# Patient Record
Sex: Male | Born: 1955 | Race: White | Hispanic: No | Marital: Married | Smoking: Former smoker
Health system: Southern US, Community
[De-identification: ages and names within clinical notes are randomized; demographics above are authoritative.]

## PROBLEM LIST (undated history)

## (undated) DIAGNOSIS — G4730 Sleep apnea, unspecified: Secondary | ICD-10-CM

## (undated) DIAGNOSIS — D649 Anemia, unspecified: Secondary | ICD-10-CM

## (undated) DIAGNOSIS — R519 Headache, unspecified: Secondary | ICD-10-CM

## (undated) DIAGNOSIS — M1990 Unspecified osteoarthritis, unspecified site: Secondary | ICD-10-CM

## (undated) DIAGNOSIS — C801 Malignant (primary) neoplasm, unspecified: Secondary | ICD-10-CM

## (undated) DIAGNOSIS — T8859XA Other complications of anesthesia, initial encounter: Secondary | ICD-10-CM

## (undated) DIAGNOSIS — T884XXA Failed or difficult intubation, initial encounter: Secondary | ICD-10-CM

## (undated) HISTORY — PX: BACK SURGERY: SHX140

## (undated) HISTORY — PX: SHOULDER ARTHROSCOPY: SHX128

## (undated) HISTORY — DX: Headache, unspecified: R51.9

## (undated) HISTORY — PX: MANDIBLE FRACTURE SURGERY: SHX706

## (undated) HISTORY — PX: CHOLECYSTECTOMY: SHX55

## (undated) HISTORY — DX: Malignant (primary) neoplasm, unspecified: C80.1

## (undated) HISTORY — DX: Unspecified osteoarthritis, unspecified site: M19.90

## (undated) HISTORY — PX: COLONOSCOPY: SHX174

## (undated) HISTORY — DX: Failed or difficult intubation, initial encounter: T88.4XXA

## (undated) HISTORY — PX: HERNIA REPAIR: SHX51

## (undated) HISTORY — DX: Anemia, unspecified: D64.9

## (undated) HISTORY — DX: Sleep apnea, unspecified: G47.30

## (undated) HISTORY — DX: Other complications of anesthesia, initial encounter: T88.59XA

---

## 2003-01-31 ENCOUNTER — Ambulatory Visit (HOSPITAL_BASED_OUTPATIENT_CLINIC_OR_DEPARTMENT_OTHER): Admission: RE | Admit: 2003-01-31 | Discharge: 2003-01-31 | Payer: Self-pay | Admitting: Orthopedic Surgery

## 2003-02-25 ENCOUNTER — Ambulatory Visit (HOSPITAL_BASED_OUTPATIENT_CLINIC_OR_DEPARTMENT_OTHER): Admission: RE | Admit: 2003-02-25 | Discharge: 2003-02-25 | Payer: Self-pay | Admitting: Otolaryngology

## 2008-10-03 ENCOUNTER — Encounter (INDEPENDENT_AMBULATORY_CARE_PROVIDER_SITE_OTHER): Payer: Self-pay | Admitting: Oncology

## 2008-10-03 ENCOUNTER — Other Ambulatory Visit: Admission: RE | Admit: 2008-10-03 | Discharge: 2008-10-03 | Payer: Self-pay | Admitting: Oncology

## 2011-05-17 NOTE — Op Note (Signed)
Kurt [name], [name] NO.:  [id number]   MEDICAL RECORD NO.:  [id number]                   PATIENT TYPE:  AMB   LOCATION:  DSC                                  FACILITY:  [facility]   PHYSICIAN:  Feliberto Gottron. Turner Daniels, M.D.                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   OPERATION PERFORMED:  Left shoulder arthroscopic anterior inferior  acromioplasty and distal clavicle spur excision.   SURGEON:  Feliberto Gottron. Turner Daniels, M.D.   ASSISTANTLaural Benes. Su Hilt, P.A.-C   ANESTHESIA:  Interscalene block plus general endotracheal.   ESTIMATED BLOOD LOSS:  Minimal.   FLUIDS REPLACED:  1L of crystalloid.   DRAINS:  None.   TOURNIQUET TIME:  None.   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  He has similar pain on the left  shoulder, plain x-ray showed a type 2 to type 3 subacromial and left  subclavicular spurring and he has failed conservative treatment with therapy  with anti-inflammatory medicine.  He has declined a cortisone injection and  desires elective left shoulder arthroscopic anterior, inferior acromioplasty  and distal clavicle spur excision.   DESCRIPTION OF PROCEDURE:  The patient was identified by arm band and taken  to the operating room at [facility] after the successful  induction of left upper extremity interscalene block anesthesia.  He  underwent the induction of general endotracheal anesthesia in the supine  position but did require use of a LMA with passage of the  endotracheal tube  through the LMA.  The patient was placed in the beach chair position.  The  left upper extremity was prepped and draped in the usual sterile fashion  from the wrist to the hemithorax.  The skin along the anterolateral and  posterior aspect of the acromion process was infiltrated with 2 to 3 cc of  0.5% Marcaine with epinephrine solution and then using a #11 blade, standard  portals were made 1.5 cm anterior to the [facility] joint, lateral to the junction  of the middle and posterior thirds of the acromion, posterior to the  posterolateral corner of the acromion process.  The inflow was placed  anteriorly, the arthroscope laterally and a 4.2 great white sucker shaver  posteriorly allowing subacromial bursectomy and outlining of the subacromial  spur which was then removed with a 4.5 hooded Vortex bur from the posterior  portal.  We did occasionally use the underwater electrocautery to take care  of any small bleeders.  We identified the subclavicular spur and likewise  removed this with a 4.5 hooded Vortex bur.  The shoulder was then taken  through a range of motion to assess the flexion abduction confirming an  intact rotator cuff externally.  The arthroscope was then introduced into  the glenohumeral joint itself using the posterior portal with the inflow  attached to the scope where we documented an intact labrum, biceps and  biceps anchor.  The articular cartilage of the glenoid and the humerus was  in excellent condition.  The subscapularis, supraspinatus and infraspinatus  tendon insertions were all intact.  At this point the shoulder was washed  out with normal saline solution.  The arthroscopic instruments were removed.  A dressing of Xeroform, 4 x 4 dressing sponges and paper tape applied.  Patient was then awakened and taken to the recovery room without difficulty.                                                  Feliberto Gottron. Turner Daniels, M.D.    Ovid Curd  D:  01/31/2003   T:  01/31/2003  Job:  [id number]

## 2017-07-25 DIAGNOSIS — R972 Elevated prostate specific antigen [PSA]: Secondary | ICD-10-CM | POA: Insufficient documentation

## 2017-07-25 DIAGNOSIS — N138 Other obstructive and reflux uropathy: Secondary | ICD-10-CM | POA: Insufficient documentation

## 2017-07-25 DIAGNOSIS — N401 Enlarged prostate with lower urinary tract symptoms: Secondary | ICD-10-CM | POA: Insufficient documentation

## 2020-01-26 ENCOUNTER — Other Ambulatory Visit: Payer: Self-pay | Admitting: Sports Medicine

## 2020-01-26 ENCOUNTER — Other Ambulatory Visit: Payer: Self-pay

## 2020-01-26 ENCOUNTER — Encounter: Payer: Self-pay | Admitting: Sports Medicine

## 2020-01-26 ENCOUNTER — Ambulatory Visit: Payer: BC Managed Care – PPO | Admitting: Sports Medicine

## 2020-01-26 DIAGNOSIS — M722 Plantar fascial fibromatosis: Secondary | ICD-10-CM | POA: Diagnosis not present

## 2020-01-26 DIAGNOSIS — M79672 Pain in left foot: Secondary | ICD-10-CM

## 2020-01-26 DIAGNOSIS — M79671 Pain in right foot: Secondary | ICD-10-CM | POA: Diagnosis not present

## 2020-01-26 NOTE — Patient Instructions (Signed)

## 2020-01-26 NOTE — Progress Notes (Signed)
Subjective: Kurt [name] is a 64 y.o. male patient presents to office with complaint of moderate heel pain on the left and right.  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  Denies any other pedal complaints.   Review of Systems  All other systems reviewed and are negative.   There are no problems to display for this patient.   No current outpatient medications on file prior to visit.   No current facility-administered medications on file prior to visit.    Not on File  Objective: Physical Exam General: The patient is alert and oriented x3 in no acute distress.  Dermatology: Skin is warm, dry and supple bilateral lower extremities. Nails 1-10 are normal. There is no erythema, edema, no eccymosis, no open lesions present. Integument is otherwise unremarkable.  Vascular: Dorsalis Pedis pulse and Posterior Tibial pulse are 2/4 bilateral. Capillary fill time is immediate to all digits.  Neurological: Grossly intact to light touch with an achilles reflex of +2/5 and a  negative Tinel's sign bilateral.  Musculoskeletal: Tenderness to palpation at the medial calcaneal tubercale and through the insertion of the plantar fascia on the left and right foot. No pain with compression of calcaneus bilateral. No pain with tuning fork to calcaneus bilateral. No pain with calf compression bilateral. There is decreased Ankle joint range of motion bilateral. All other joints range of motion within  normal limits bilateral. Strength 5/5 in all groups bilateral.    Assessment and Plan: Problem List Items Addressed This Visit    None    Visit Diagnoses    Plantar fasciitis, bilateral    -  Primary   Heel pain, bilateral          -Complete examination performed.  -Patient declined x-rays -Discussed with patient in detail the condition of plantar fasciitis, how this occurs and general treatment options. Explained both conservative and surgical treatments.  -Patient declined steroid injection or oral medications at this time -Recommended good supportive shoes and advised use of OTC insert until he can be seen for custom molded orthoses since these have worked well in the past for him before when he had orthotics from the good feet store. -Explained and dispensed to patient daily stretching exercises. -Recommend patient to ice affected area 1-2x daily. -Patient to return to office for orthotics or sooner if problems or questions arise.  Landis Martins, DPM

## 2020-01-27 ENCOUNTER — Other Ambulatory Visit: Payer: Self-pay

## 2020-01-27 ENCOUNTER — Ambulatory Visit: Payer: BC Managed Care – PPO | Admitting: Orthotics

## 2020-01-27 DIAGNOSIS — M722 Plantar fascial fibromatosis: Secondary | ICD-10-CM

## 2020-02-08 NOTE — Progress Notes (Signed)
Patient cast today for CMFO to address plantar fas; patient previous had OTS ([employer] feet) and they didn't seem to help.   Plan on Richy to fab

## 2020-08-11 ENCOUNTER — Other Ambulatory Visit: Payer: Self-pay

## 2020-08-11 ENCOUNTER — Ambulatory Visit: Payer: BC Managed Care – PPO | Admitting: Sports Medicine

## 2020-08-11 ENCOUNTER — Encounter: Payer: Self-pay | Admitting: Sports Medicine

## 2020-08-11 ENCOUNTER — Other Ambulatory Visit: Payer: Self-pay | Admitting: Sports Medicine

## 2020-08-11 DIAGNOSIS — M25571 Pain in right ankle and joints of right foot: Secondary | ICD-10-CM | POA: Diagnosis not present

## 2020-08-11 DIAGNOSIS — S93401A Sprain of unspecified ligament of right ankle, initial encounter: Secondary | ICD-10-CM

## 2020-08-11 NOTE — Progress Notes (Signed)
Subjective: Kurt [name] is a 64 y.o. male patient who presents to office for evaluation of right ankle pain. Patient complains of continued pain in the ankle after spraining it on July fork went to [facility] allergic care and was told that he did not have a break after he had an x-ray reports that he was icing and taping and does not recall why he stopped.  Reports pain 3 out of 10 swelling to right ankle. Patient denies any other pedal complaints.  There are no problems to display for this patient.   No current outpatient medications on file prior to visit.   No current facility-administered medications on file prior to visit.    Not on File  Objective:  General: Alert and oriented x3 in no acute distress  Dermatology: No open lesions bilateral lower extremities, no webspace macerations, no ecchymosis bilateral, all nails x 10 are well manicured.  Vascular: Dorsalis Pedis and Posterior Tibial pedal pulses palpable, Capillary Fill Time 3 seconds,(+) pedal hair growth bilateral, no edema bilateral lower extremities, Temperature gradient within normal limits.  Neurology: Kurt [name] sensation intact via light touch bilateral.  Musculoskeletal: Mild tenderness with palpation at lateral ankle ligaments especially over the anterior talofibular ligament on the right, negative tib-fib stress, history of instability. No pain with calf compression bilateral. Range of motion within normal limits with minimal guarding on right ankle. Strength within normal limits in all groups bilateral.   Assessment and Plan: Problem List Items Addressed This Visit    None    Visit Diagnoses    Moderate ankle sprain, right, initial encounter    -  Primary   Acute right ankle pain           -Complete examination performed -Patient had x-rays performed at [facility] and will get records to review however at this time we will proceed with symptomatically treating patient for ankle sprain since verbally he told me that  there was no break or fracture on previous x-rays -Discussed treatement options for sprain injury and advised patient that it can take several months to get better -Rx Tri-Lock ankle stabilizer to use as instructed -Recommend rest ice elevation daily until symptoms improve -Recommend good supportive shoes daily for foot type -Recommend to avoid activities that may be too strenuous that can cause symptoms to worsen -Patient to return to office in 1 month or sooner if condition worsens.  Kurt [name], DPM

## 2020-08-14 ENCOUNTER — Telehealth: Payer: Self-pay

## 2020-08-14 NOTE — Telephone Encounter (Signed)
Contacted Pt and LVM giving him recommendations from DR. Stover to keep brace, icng, elevations until symptoms improve. And Dr. Cannon Kettle has review his records from [facility] and there is no Fx

## 2020-08-14 NOTE — Telephone Encounter (Signed)
-----   Message from Landis Martins, [place] sent at 08/14/2020  9:00 AM EDT ----- Regarding: Records Let patient know that I reviewed his records from [facility]. Good news there is no fracture. His symptoms are consistent with a sprain injury. He should keep with brace, rest, ice, elevation until symptoms improve Thanks Dr. Chauncey Cruel

## 2020-09-12 ENCOUNTER — Ambulatory Visit: Payer: BC Managed Care – PPO | Admitting: Sports Medicine
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
## 2020-11-20 ENCOUNTER — Encounter: Payer: Self-pay | Admitting: Family Medicine

## 2020-11-20 ENCOUNTER — Other Ambulatory Visit: Payer: Self-pay

## 2020-11-20 ENCOUNTER — Ambulatory Visit (INDEPENDENT_AMBULATORY_CARE_PROVIDER_SITE_OTHER): Payer: BC Managed Care – PPO | Admitting: Family Medicine

## 2020-11-20 VITALS — BP 138/88 | HR 76 | Ht 74.5 in | Wt 252.0 lb

## 2020-11-20 DIAGNOSIS — M436 Torticollis: Secondary | ICD-10-CM

## 2020-11-20 DIAGNOSIS — M542 Cervicalgia: Secondary | ICD-10-CM

## 2020-11-20 NOTE — Patient Instructions (Addendum)
Thank you for coming in today.  I've referred you to Physical Therapy.  Let us know if you don't hear from them in one week.  You should hear from MRI scheduling within 1 week. If you do not hear please let me know.   Recheck after MRI.   View at [email or web address] using code: [email or web address]

## 2020-12-05 ENCOUNTER — Telehealth: Payer: Self-pay | Admitting: Family Medicine

## 2020-12-05 MED ORDER — LORAZEPAM 0.5 MG PO TABS: ORAL_TABLET | ORAL | 0 refills | Status: DC

## 2020-12-05 NOTE — Telephone Encounter (Signed)
Received request for anxiety medication from MRI technician for patient prior to MRI. Ativan prescribed for use prior to MRI.  Patient will need a driver.

## 2020-12-06 NOTE — Telephone Encounter (Signed)
Called and left detailed message regarding Ativan medication prescription to use prior to MRI on 12/09/20.

## 2020-12-09 ENCOUNTER — Other Ambulatory Visit: Payer: Self-pay

## 2020-12-09 ENCOUNTER — Ambulatory Visit (INDEPENDENT_AMBULATORY_CARE_PROVIDER_SITE_OTHER): Payer: BC Managed Care – PPO

## 2020-12-09 DIAGNOSIS — M47812 Spondylosis without myelopathy or radiculopathy, cervical region: Secondary | ICD-10-CM

## 2020-12-09 DIAGNOSIS — M4802 Spinal stenosis, cervical region: Secondary | ICD-10-CM

## 2020-12-09 DIAGNOSIS — M542 Cervicalgia: Secondary | ICD-10-CM

## 2020-12-09 IMAGING — MR MR CERVICAL SPINE W/O CM
5 series · 40 of 48 positions shown · non-contrast
Comparison: None.

CLINICAL DATA: Neck pain, chronic, degenerative changes on xray

EXAM:
MRI CERVICAL SPINE WITHOUT CONTRAST
TECHNIQUE: Multiplanar, multisequence MR imaging of the cervical spine was
performed. No intravenous contrast was administered.

[Series 4: T2 · sagittal · 3.0mm · 0.69mm/px · 6 of 13 slices shown (1 of 2)]
[im 1/13]
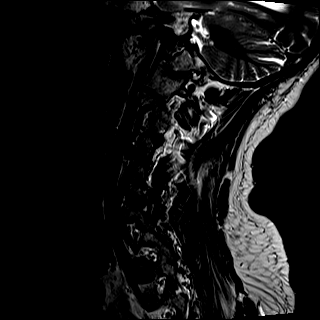
[im 3/13]
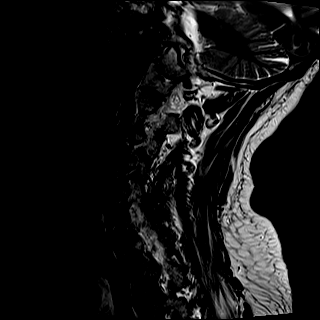
[im 5/13]
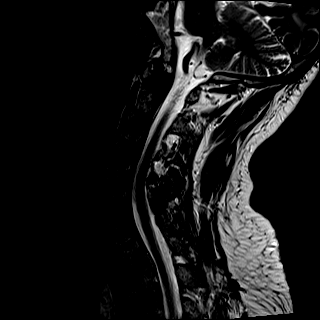
[im 8/13]
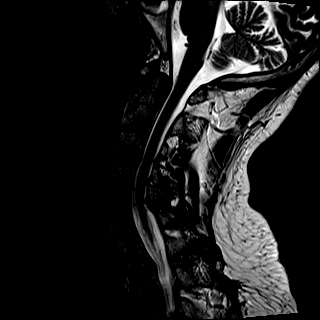
[im 10/13]
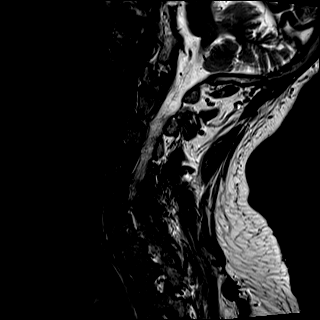
[im 13/13]
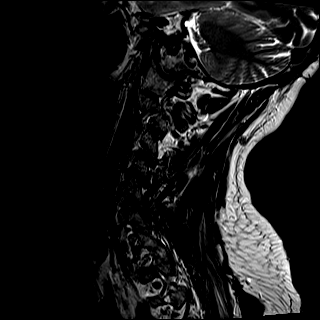

[Series 5: T1 · sagittal · 3.0mm · 0.86mm/px · 5 of 13 slices shown]
[im 1/13]
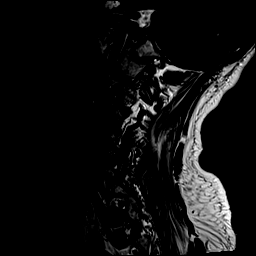
[im 4/13]
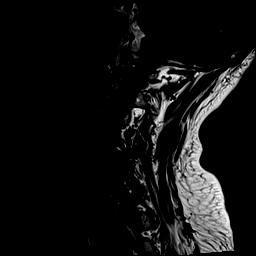
[im 7/13]
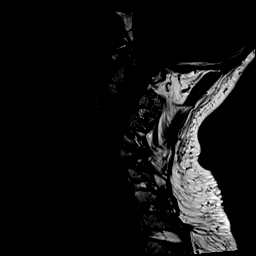
[im 10/13]
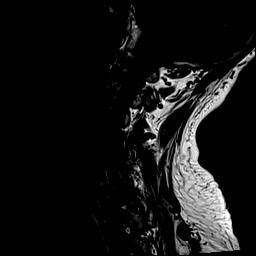
[im 13/13]
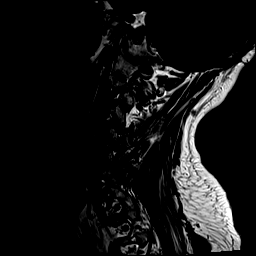

[Series 6: STIR · sagittal · 3.0mm · 0.69mm/px · 5 of 13 slices shown]
[im 1/13]
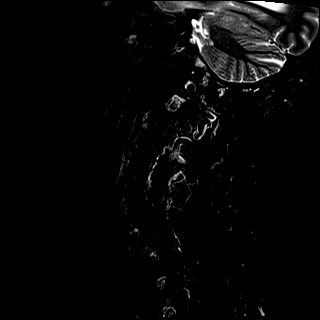
[im 4/13]
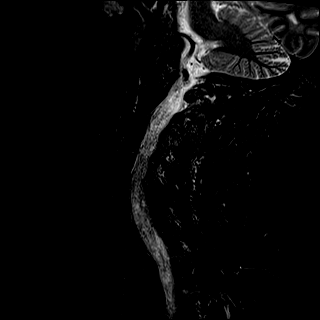
[im 7/13]
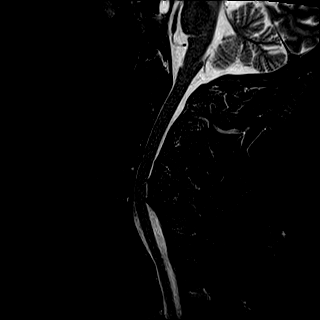
[im 10/13]
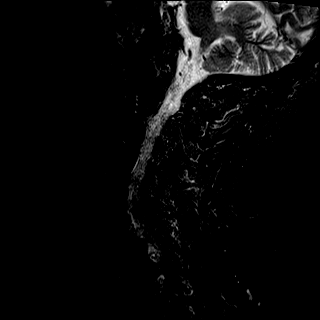
[im 13/13]
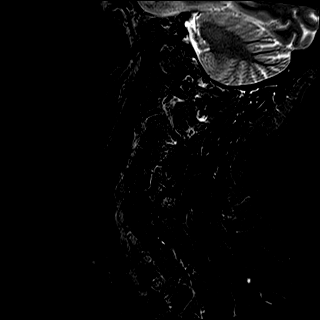

[Series 7: T2 · axial · 3.0mm · 0.62mm/px · z∈[-144,-8]mm · 16 of 38 slices shown (2 of 2)]
[im 1/38]
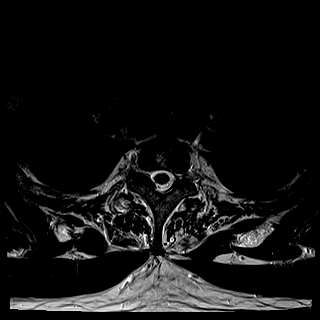
[im 3/38]
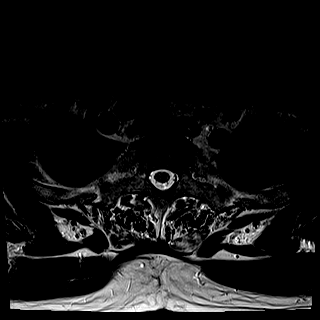
[im 5/38]
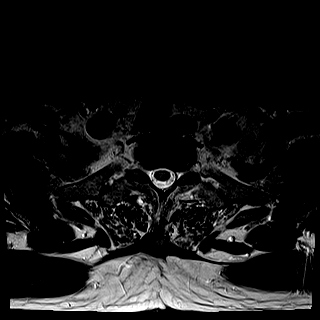
[im 8/38]
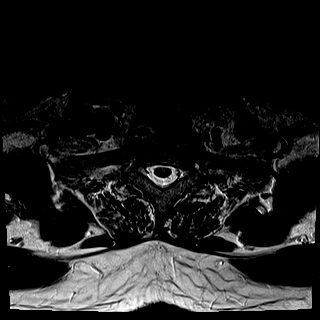
[im 10/38]
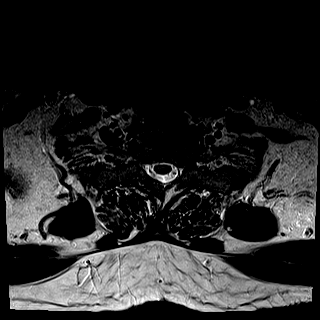
[im 13/38]
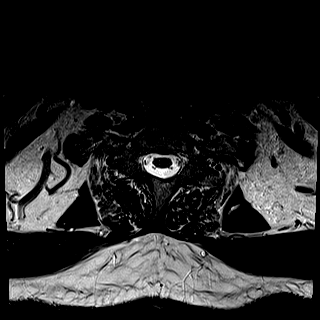
[im 15/38]
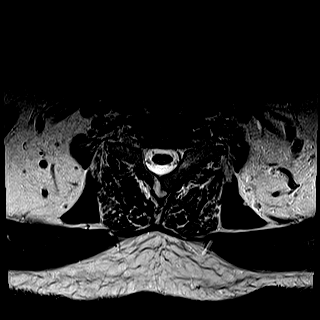
[im 18/38]
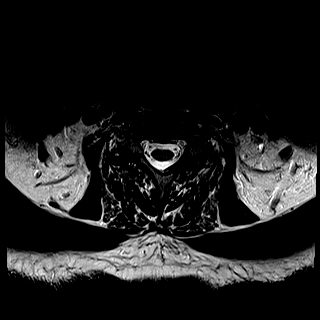
[im 20/38]
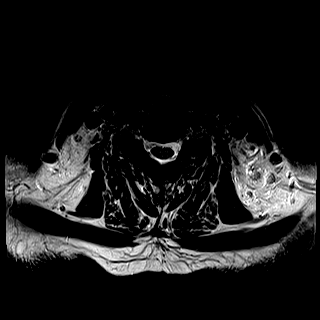
[im 23/38]
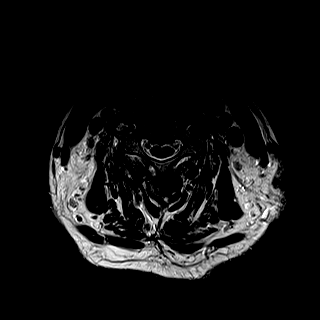
[im 25/38]
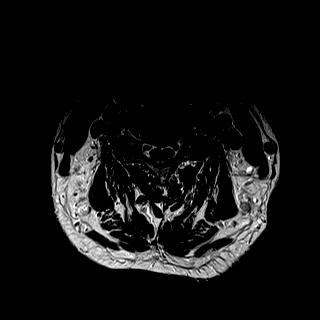
[im 28/38]
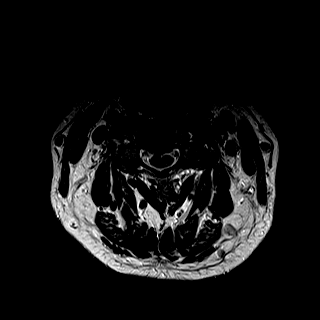
[im 30/38]
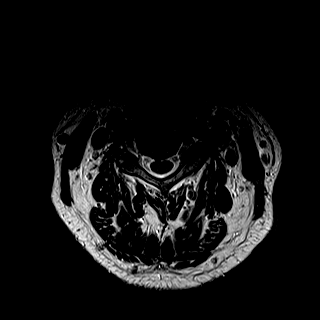
[im 33/38]
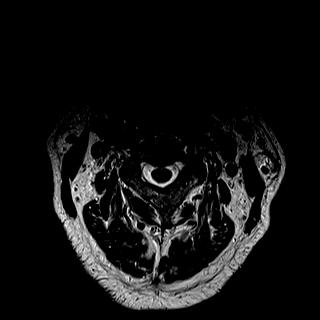
[im 35/38]
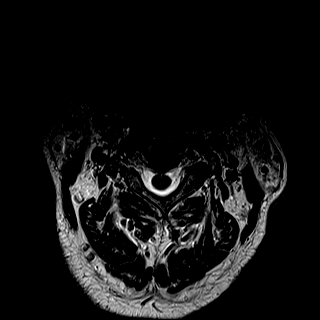
[im 38/38]
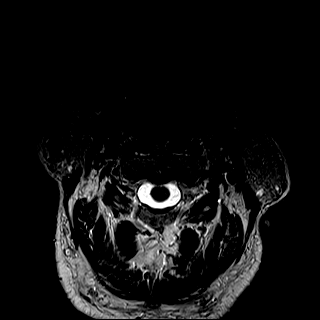

[Series 8: mpgr ax · axial · 3.0mm · 0.35mm/px · z∈[-126,+3]mm · 8 of 38 slices shown]
[im 3/38]
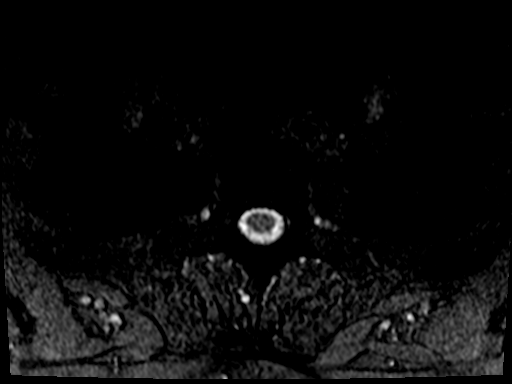
[im 8/38]
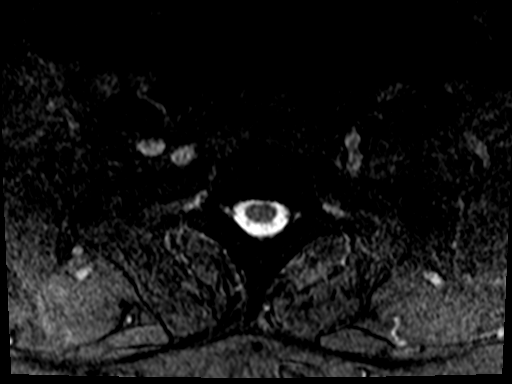
[im 13/38]
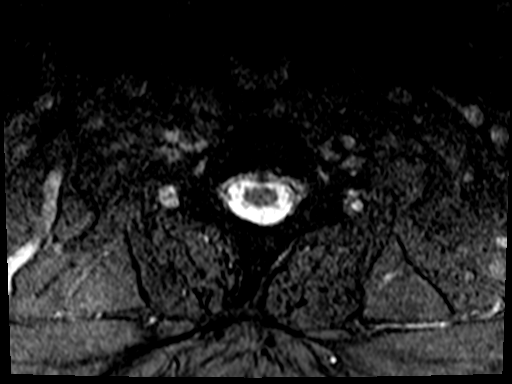
[im 18/38]
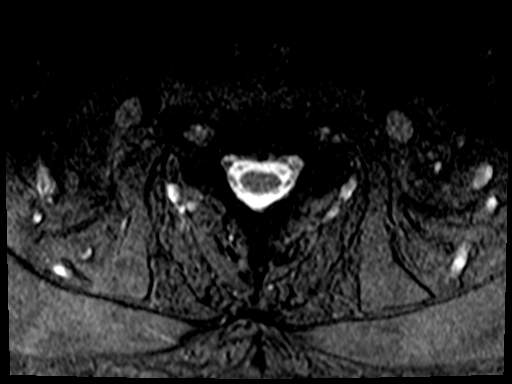
[im 23/38]
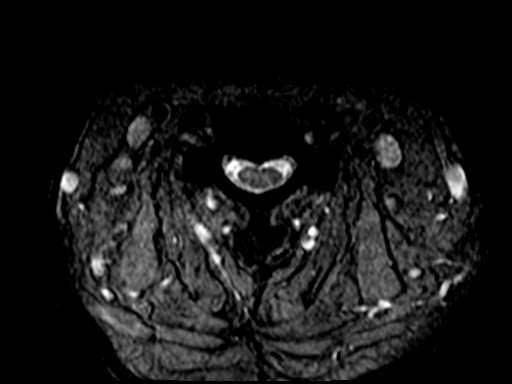
[im 28/38]
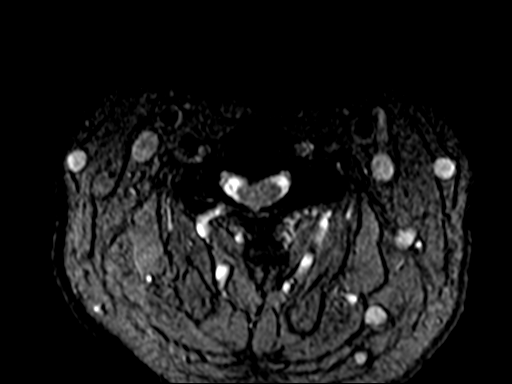
[im 33/38]
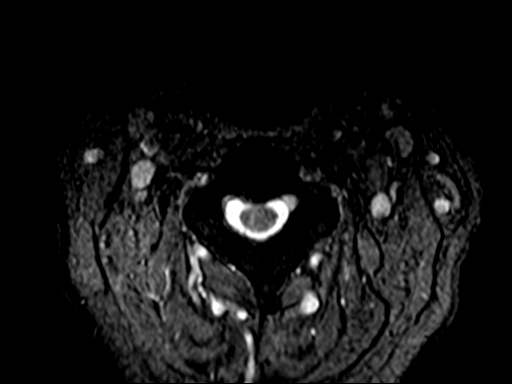
[im 38/38]
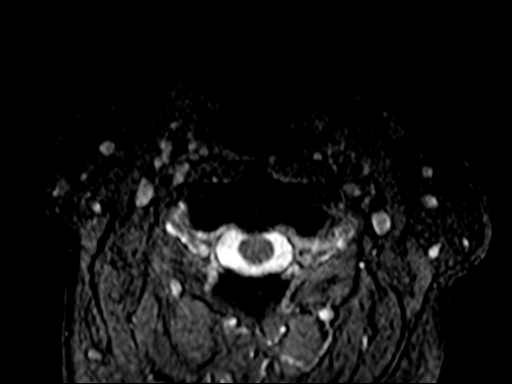

[40 of 48 positions shown; findings below may reference images not displayed]

FINDINGS: Alignment: Straightening of lordosis. Sequela of fusion at the C6-T1
levels.

Vertebrae: Normal bone marrow signal intensity. No focal osseous
lesion.

Cord: Normal signal and morphology.

Posterior Fossa, vertebral arteries: Negative.

Disc levels: Multilevel desiccation.

C2-3: No significant disc bulge. Patent spinal canal and neural
foramen. Minimal facet degenerative spurring.

C3-4: Small disc osteophyte complex with central protrusion abutting
the ventral cord. Uncovertebral and facet hypertrophy. Mild spinal
canal and bilateral neural foraminal narrowing.

C4-5: Small disc osteophyte complex with central protrusion abutting
the ventral cord. Uncovertebral and facet degenerative spurring.
Prominent ligamentum flavum. Mild spinal canal and bilateral neural
foraminal narrowing.

C5-6: Disc osteophyte complex with shallow central protrusion.
Uncovertebral and facet degenerative spurring. Patent spinal canal.
Mild bilateral neural foraminal narrowing.

C6-7: Minimal uncovertebral and facet degenerative spurring. Patent
spinal canal and right neural foramen. Mild left neural foraminal
narrowing.

C7-T1: No significant disc bulge. Patent spinal canal and neural
foramen.

Paraspinal tissues: Negative.
IMPRESSION: Multilevel spondylosis.  Sequela of fusion at the C6-T1 levels.

Mild C3-5 spinal canal and bilateral neural foraminal narrowing.

Mild bilateral C5-6 and left C6-7 neural foraminal narrowing.

## 2020-12-13 ENCOUNTER — Encounter: Payer: Self-pay | Admitting: Urology

## 2020-12-13 ENCOUNTER — Ambulatory Visit: Payer: BC Managed Care – PPO | Admitting: Urology

## 2020-12-13 ENCOUNTER — Other Ambulatory Visit
Admission: RE | Admit: 2020-12-13 | Discharge: 2020-12-13 | Disposition: A | Discharge status: Home / Self Care | Payer: BC Managed Care – PPO | Source: Ambulatory Visit | Attending: Urology | Admitting: Urology

## 2020-12-13 ENCOUNTER — Other Ambulatory Visit: Payer: Self-pay | Admitting: Urology

## 2020-12-13 ENCOUNTER — Other Ambulatory Visit: Payer: Self-pay

## 2020-12-13 VITALS — BP 164/96 | HR 87 | Ht 74.0 in | Wt 245.0 lb

## 2020-12-13 DIAGNOSIS — N401 Enlarged prostate with lower urinary tract symptoms: Secondary | ICD-10-CM | POA: Diagnosis not present

## 2020-12-13 DIAGNOSIS — N138 Other obstructive and reflux uropathy: Secondary | ICD-10-CM

## 2020-12-13 DIAGNOSIS — Z01812 Encounter for preprocedural laboratory examination: Secondary | ICD-10-CM | POA: Diagnosis not present

## 2020-12-13 DIAGNOSIS — Z20822 Contact with and (suspected) exposure to covid-19: Secondary | ICD-10-CM | POA: Insufficient documentation

## 2020-12-13 NOTE — Progress Notes (Signed)
   12/13/20 2:30 PM   Kurt [name] 1956/03/17 [id number]  CC: BPH and urinary symptoms  HPI: I saw Kurt [name] in urology clinic today for evaluation of BPH and urinary symptoms.  He is a healthy 64 year old male with a long history of urinary symptoms of weak stream, urgency, frequency, difficulty initiating urination, and intermittency.  He has been on maximal medical therapy with Flomax and finasteride.  He denies any UTIs or episodes of retention.  He was previously evaluated both at [facility] and by Dr. Smitty Pluck in [place].  He was last seen at [facility] in 2018 by Dr. Darcus Austin when he underwent a prostate biopsy for an elevated PSA of 6.45 in July 2018.  This showed a 64 g prostate, and all biopsies were negative.  PSA is stable at 6.3 in October 2021.  He reportedly had a cystoscopy in [place] recently that did not show any urethral stricture or bladder abnormality, but an enlarged and obstructive prostate.  He is interested in pursuing HOLEP for his severe urinary symptoms.  His PVR is normal today at 46 mL.  UA pending.   Social History:  reports that he has quit smoking. His smoking use included cigarettes. He has never used smokeless tobacco. No history on file for alcohol use and drug use.  Physical Exam: BP (!) 164/96   Pulse 87   Ht 6\' 2"  (1.88 m)   Wt 245 lb (111.1 kg)   BMI 31.46 kg/m    Constitutional:  Alert and oriented, No acute distress. Cardiovascular: No clubbing, cyanosis, or edema. Respiratory: Normal respiratory effort, no increased work of breathing. GI: Abdomen is soft, nontender, nondistended, no abdominal masses  Laboratory Data: Pending  Pertinent Imaging: None to review  Assessment & Plan:   Healthy 64 year old male with long history of urinary symptoms that have acutely worsened despite maximal medical therapy with Flomax and finasteride.  He underwent a negative prostate biopsy in 2018 for PSA of 6.45 that showed a 64 g prostate, and PSA is stable at 6.3.  We  discussed the risks and benefits of HoLEP at length.  The procedure requires general anesthesia and takes 2 to 3 hours, and a holmium laser is used to enucleate the prostate and push this tissue into the bladder.  A morcellator is then used to remove this tissue, which is sent for pathology.  The vast majority of patients are able to discharge the same day with a catheter in place for 2 to 3 days, and will follow-up in clinic for a voiding trial.  Approximately 5% of patients will be admitted overnight to monitor the urine, or if they have multiple co-morbidities.  We specifically discussed the risks of bleeding, infection, retrograde ejaculation, temporary urgency and urge incontinence, very low risk of long-term incontinence, pathologic evaluation of prostate tissue and possible detection of prostate cancer or other malignancy, and possible need for additional procedures.  Schedule HOLEP  Kurt Madrid, MD 12/13/2020  [facility] [address] [place], [place] [address] [phone]

## 2020-12-13 NOTE — H&P (View-Only) (Signed)
   12/13/20 2:30 PM   Kurt [name] 02-26-1956 [id number]  CC: BPH and urinary symptoms  HPI: I saw Kurt [name] in urology clinic today for evaluation of BPH and urinary symptoms.  He is a healthy 64 year old male with a long history of urinary symptoms of weak stream, urgency, frequency, difficulty initiating urination, and intermittency.  He has been on maximal medical therapy with Flomax and finasteride.  He denies any UTIs or episodes of retention.  He was previously evaluated both at [facility] and by Dr. Smitty Pluck in [place].  He was last seen at [facility] in 2018 by Dr. Darcus Austin when he underwent a prostate biopsy for an elevated PSA of 6.45 in July 2018.  This showed a 64 g prostate, and all biopsies were negative.  PSA is stable at 6.3 in October 2021.  He reportedly had a cystoscopy in [place] recently that did not show any urethral stricture or bladder abnormality, but an enlarged and obstructive prostate.  He is interested in pursuing HOLEP for his severe urinary symptoms.  His PVR is normal today at 46 mL.  UA pending.   Social History:  reports that he has quit smoking. His smoking use included cigarettes. He has never used smokeless tobacco. No history on file for alcohol use and drug use.  Physical Exam: BP (!) 164/96   Pulse 87   Ht 6\' 2"  (1.88 m)   Wt 245 lb (111.1 kg)   BMI 31.46 kg/m    Constitutional:  Alert and oriented, No acute distress. Cardiovascular: No clubbing, cyanosis, or edema. Respiratory: Normal respiratory effort, no increased work of breathing. GI: Abdomen is soft, nontender, nondistended, no abdominal masses  Laboratory Data: Pending  Pertinent Imaging: None to review  Assessment & Plan:   Healthy 64 year old male with long history of urinary symptoms that have acutely worsened despite maximal medical therapy with Flomax and finasteride.  He underwent a negative prostate biopsy in 2018 for PSA of 6.45 that showed a 64 g prostate, and PSA is stable at 6.3.  We  discussed the risks and benefits of HoLEP at length.  The procedure requires general anesthesia and takes 2 to 3 hours, and a holmium laser is used to enucleate the prostate and push this tissue into the bladder.  A morcellator is then used to remove this tissue, which is sent for pathology.  The vast majority of patients are able to discharge the same day with a catheter in place for 2 to 3 days, and will follow-up in clinic for a voiding trial.  Approximately 5% of patients will be admitted overnight to monitor the urine, or if they have multiple co-morbidities.  We specifically discussed the risks of bleeding, infection, retrograde ejaculation, temporary urgency and urge incontinence, very low risk of long-term incontinence, pathologic evaluation of prostate tissue and possible detection of prostate cancer or other malignancy, and possible need for additional procedures.  Schedule HOLEP  Kurt Madrid, MD 12/13/2020  [facility] [address], [place], [place] [address] [phone]

## 2020-12-13 NOTE — Patient Instructions (Signed)

## 2020-12-14 LAB — URINALYSIS, COMPLETE
Bilirubin, UA: NEGATIVE
Glucose, UA: NEGATIVE
Ketones, UA: NEGATIVE
Leukocytes,UA: NEGATIVE
Nitrite, UA: NEGATIVE
Protein,UA: NEGATIVE
Specific Gravity, UA: 1.02 (ref 1.005–1.030)
Urobilinogen, Ur: 0.2 mg/dL (ref 0.2–1.0)
pH, UA: 7 (ref 5.0–7.5)

## 2020-12-14 LAB — SARS CORONAVIRUS 2 (TAT 6-24 HRS): SARS Coronavirus 2: NEGATIVE

## 2020-12-14 LAB — MICROSCOPIC EXAMINATION
Bacteria, UA: NONE SEEN
Epithelial Cells (non renal): NONE SEEN /hpf (ref 0–10)

## 2020-12-15 ENCOUNTER — Encounter
Admission: RE | Admit: 2020-12-15 | Discharge: 2020-12-15 | Disposition: A | Discharge status: Home / Self Care | Payer: BC Managed Care – PPO | Source: Ambulatory Visit | Attending: Urology | Admitting: Urology

## 2020-12-15 NOTE — Patient Instructions (Signed)
Your procedure is scheduled on:12-18-20 MONDAY Report to the Registration Desk on the 1st floor of the [facility]. To find out your arrival time, please call [phone] between 1PM - 3PM on:12-15-20 FRIDAY  REMEMBER: Instructions that are not followed completely may result in serious medical risk, up to and including death; or upon the discretion of your surgeon and anesthesiologist your surgery may need to be rescheduled.  Do not eat food OR drink any liquids after midnight the night before surgery.  No gum chewing, lozengers or hard candies.  TAKE THESE MEDICATIONS THE MORNING OF SURGERY WITH A SIP OF WATER: -NONE  One week prior to surgery: Stop Anti-inflammatories (NSAIDS) such as Advil, Aleve, Ibuprofen, Motrin, Naproxen, Naprosyn and Aspirin based products such as Excedrin, Goodys Powder, BC Powder-OK TO TAKE TYLENOL IF NEEDED  Stop ANY OVER THE COUNTER supplements until after surgery-STOP VALERIAN ROOT NOW-YOU MAY RESUME AFTER SURGERY  No Alcohol for 24 hours before or after surgery.  No Smoking including e-cigarettes for 24 hours prior to surgery.  No chewable tobacco products for at least 6 hours prior to surgery.  No nicotine patches on the day of surgery.  Do not use any "recreational" drugs for at least a week prior to your surgery.  Please be advised that the combination of cocaine and anesthesia may have negative outcomes, up to and including death. If you test positive for cocaine, your surgery will be cancelled.  On the morning of surgery brush your teeth with toothpaste and water, you may rinse your mouth with mouthwash if you wish. Do not swallow any toothpaste or mouthwash.  Do not wear jewelry, make-up, hairpins, clips or nail polish.  Do not wear lotions, powders, or perfumes.   Do not shave body from the neck down 48 hours prior to surgery just in case you cut yourself which could leave a site for infection.  Also, freshly shaved skin may become  irritated if using the CHG soap.  Contact lenses, hearing aids and dentures may not be worn into surgery.  Do not bring valuables to the hospital. [facility] is not responsible for any missing/lost belongings or valuables.   Use CHG Soap or wipes as directed on instruction sheet.  Total Shoulder Arthroplasty:  use Benzolyl Peroxide 5% Gel as directed on instruction sheet.  Fleets enema or Magnesium Citrate as directed.  Bring your C-PAP to the hospital with you in case you may have to spend the night.   Notify your doctor if there is any change in your medical condition (cold, fever, infection).  Wear comfortable clothing (specific to your surgery type) to the hospital.  Plan for stool softeners for home use; pain medications have a tendency to cause constipation. You can also help prevent constipation by eating foods high in fiber such as fruits and vegetables and drinking plenty of fluids as your diet allows.  After surgery, you can help prevent lung complications by doing breathing exercises.  Take deep breaths and cough every 1-2 hours. Your doctor may order a device called an Incentive Spirometer to help you take deep breaths. When coughing or sneezing, hold a pillow firmly against your incision with both hands. This is called "splinting." Doing this helps protect your incision. It also decreases belly discomfort.  If you are being admitted to the hospital overnight, leave your suitcase in the car. After surgery it may be brought to your room.  If you are being discharged the day of surgery, you will not be  allowed to drive home. You will need a responsible adult (18 years or older) to drive you home and stay with you that night.   If you are taking public transportation, you will need to have a responsible adult (18 years or older) with you. Please confirm with your physician that it is acceptable to use public transportation.   Please call the [facility] at  [phone] if you have any questions about these instructions.  Visitation Policy:  Patients undergoing a surgery or procedure may have one family member or support person with them as long as that person is not COVID-19 positive or experiencing its symptoms.  That person may remain in the waiting area during the procedure.  Inpatient Visitation Update:   In an effort to ensure the safety of our team members and our patients, we are implementing a change to our visitation policy:  Effective Monday, Aug. 9, at 7 a.m., inpatients will be allowed one support person.  o The support person may change daily.  o The support person must pass our screening, gel in and out, and wear a mask at all times, including in the patient's room.  o Patients must also wear a mask when staff or their support person are in the room.  o Masking is required regardless of vaccination status.  Systemwide, no visitors 17 or younger.

## 2020-12-17 MED ORDER — CEFAZOLIN SODIUM-DEXTROSE 2-4 GM/100ML-% IV SOLN
2.0000 g | INTRAVENOUS | Status: AC
  Administered 2020-12-18: 12:00:00 2 g via INTRAVENOUS

## 2020-12-17 MED ORDER — ORAL CARE MOUTH RINSE: 15.0000 mL | Freq: Once | OROMUCOSAL | Status: AC

## 2020-12-17 MED ORDER — FAMOTIDINE 20 MG PO TABS: 20.0000 mg | ORAL_TABLET | Freq: Once | ORAL | Status: AC

## 2020-12-17 MED ORDER — LACTATED RINGERS IV SOLN: INTRAVENOUS | Status: DC

## 2020-12-17 MED ORDER — CHLORHEXIDINE GLUCONATE 0.12 % MT SOLN: 15.0000 mL | Freq: Once | OROMUCOSAL | Status: AC

## 2020-12-18 ENCOUNTER — Ambulatory Visit: Payer: BC Managed Care – PPO | Admitting: Certified Registered"

## 2020-12-18 ENCOUNTER — Ambulatory Visit
Admission: RE | Admit: 2020-12-18 | Discharge: 2020-12-18 | Disposition: A | Discharge status: Home / Self Care | Payer: BC Managed Care – PPO | Attending: Urology | Admitting: Urology

## 2020-12-18 ENCOUNTER — Encounter
Admission: RE | Disposition: A | Discharge status: Home / Self Care | Payer: Self-pay | Source: Home / Self Care | Attending: Urology

## 2020-12-18 ENCOUNTER — Other Ambulatory Visit: Payer: Self-pay

## 2020-12-18 ENCOUNTER — Encounter: Payer: Self-pay | Admitting: Urology

## 2020-12-18 DIAGNOSIS — R3129 Other microscopic hematuria: Secondary | ICD-10-CM | POA: Diagnosis not present

## 2020-12-18 DIAGNOSIS — Z87891 Personal history of nicotine dependence: Secondary | ICD-10-CM | POA: Diagnosis not present

## 2020-12-18 DIAGNOSIS — N401 Enlarged prostate with lower urinary tract symptoms: Secondary | ICD-10-CM | POA: Insufficient documentation

## 2020-12-18 DIAGNOSIS — N138 Other obstructive and reflux uropathy: Secondary | ICD-10-CM | POA: Diagnosis not present

## 2020-12-18 HISTORY — PX: HOLEP-LASER ENUCLEATION OF THE PROSTATE WITH MORCELLATION: SHX6641

## 2020-12-18 LAB — HEMOGLOBIN: Hemoglobin: 13.4 g/dL (ref 13.0–17.0)

## 2020-12-18 SURGERY — ENUCLEATION, PROSTATE, USING LASER, WITH MORCELLATION
Anesthesia: General

## 2020-12-18 MED ORDER — BELLADONNA ALKALOIDS-OPIUM 16.2-60 MG RE SUPP
RECTAL | Status: AC
  Filled 2020-12-18: qty 1

## 2020-12-18 MED ORDER — GLYCOPYRROLATE 0.2 MG/ML IJ SOLN
INTRAMUSCULAR | Status: DC | PRN
  Administered 2020-12-18: .2 mg via INTRAVENOUS

## 2020-12-18 MED ORDER — MIDAZOLAM HCL 2 MG/2ML IJ SOLN
INTRAMUSCULAR | Status: AC
  Filled 2020-12-18: qty 2

## 2020-12-18 MED ORDER — ROCURONIUM BROMIDE 10 MG/ML (PF) SYRINGE
PREFILLED_SYRINGE | INTRAVENOUS | Status: AC
  Filled 2020-12-18: qty 10

## 2020-12-18 MED ORDER — CHLORHEXIDINE GLUCONATE 0.12 % MT SOLN
OROMUCOSAL | Status: AC
  Administered 2020-12-18: 11:00:00 15 mL via OROMUCOSAL
  Filled 2020-12-18: qty 15

## 2020-12-18 MED ORDER — OXYCODONE HCL 5 MG PO TABS
ORAL_TABLET | ORAL | Status: AC
  Filled 2020-12-18: qty 1

## 2020-12-18 MED ORDER — SUCCINYLCHOLINE CHLORIDE 20 MG/ML IJ SOLN
INTRAMUSCULAR | Status: DC | PRN
  Administered 2020-12-18: 120 mg via INTRAVENOUS

## 2020-12-18 MED ORDER — ONDANSETRON HCL 4 MG/2ML IJ SOLN: 4.0000 mg | Freq: Once | INTRAMUSCULAR | Status: DC | PRN

## 2020-12-18 MED ORDER — OXYCODONE HCL 5 MG PO TABS
5.0000 mg | ORAL_TABLET | Freq: Once | ORAL | Status: AC
Start: 2020-12-18 — End: 2020-12-18
  Administered 2020-12-18: 5 mg via ORAL

## 2020-12-18 MED ORDER — ONDANSETRON HCL 4 MG/2ML IJ SOLN
INTRAMUSCULAR | Status: DC | PRN
  Administered 2020-12-18: 4 mg via INTRAVENOUS

## 2020-12-18 MED ORDER — MEPERIDINE HCL 50 MG/ML IJ SOLN: 6.2500 mg | INTRAMUSCULAR | Status: DC | PRN

## 2020-12-18 MED ORDER — FAMOTIDINE 20 MG PO TABS
ORAL_TABLET | ORAL | Status: AC
  Administered 2020-12-18: 11:00:00 20 mg via ORAL
  Filled 2020-12-18: qty 1

## 2020-12-18 MED ORDER — FENTANYL CITRATE (PF) 100 MCG/2ML IJ SOLN
INTRAMUSCULAR | Status: DC | PRN
  Administered 2020-12-18 (×2): 50 ug via INTRAVENOUS

## 2020-12-18 MED ORDER — DEXMEDETOMIDINE (PRECEDEX) IN NS 20 MCG/5ML (4 MCG/ML) IV SYRINGE
PREFILLED_SYRINGE | INTRAVENOUS | Status: DC | PRN
  Administered 2020-12-18 (×4): 4 ug via INTRAVENOUS

## 2020-12-18 MED ORDER — LIDOCAINE HCL (CARDIAC) PF 100 MG/5ML IV SOSY
PREFILLED_SYRINGE | INTRAVENOUS | Status: DC | PRN
  Administered 2020-12-18: 100 mg via INTRAVENOUS

## 2020-12-18 MED ORDER — CEFAZOLIN SODIUM-DEXTROSE 2-4 GM/100ML-% IV SOLN
INTRAVENOUS | Status: AC
  Filled 2020-12-18: qty 100

## 2020-12-18 MED ORDER — FENTANYL CITRATE (PF) 100 MCG/2ML IJ SOLN
25.0000 ug | INTRAMUSCULAR | Status: DC | PRN
  Administered 2020-12-18 (×2): 50 ug via INTRAVENOUS

## 2020-12-18 MED ORDER — SUGAMMADEX SODIUM 500 MG/5ML IV SOLN
INTRAVENOUS | Status: DC | PRN
  Administered 2020-12-18: 200 mg via INTRAVENOUS

## 2020-12-18 MED ORDER — SUCCINYLCHOLINE CHLORIDE 200 MG/10ML IV SOSY
PREFILLED_SYRINGE | INTRAVENOUS | Status: AC
  Filled 2020-12-18: qty 10

## 2020-12-18 MED ORDER — BELLADONNA ALKALOIDS-OPIUM 16.2-60 MG RE SUPP
RECTAL | Status: DC | PRN
  Administered 2020-12-18: 1 via RECTAL

## 2020-12-18 MED ORDER — FENTANYL CITRATE (PF) 100 MCG/2ML IJ SOLN
INTRAMUSCULAR | Status: AC
  Administered 2020-12-18: 13:00:00 50 ug via INTRAVENOUS
  Filled 2020-12-18: qty 2

## 2020-12-18 MED ORDER — OXYCODONE HCL 5 MG PO CAPS: 5.0000 mg | ORAL_CAPSULE | Freq: Four times a day (QID) | ORAL | 0 refills | Status: DC | PRN

## 2020-12-18 MED ORDER — FENTANYL CITRATE (PF) 100 MCG/2ML IJ SOLN
INTRAMUSCULAR | Status: AC
  Filled 2020-12-18: qty 2

## 2020-12-18 MED ORDER — DEXAMETHASONE SODIUM PHOSPHATE 10 MG/ML IJ SOLN
INTRAMUSCULAR | Status: DC | PRN
  Administered 2020-12-18: 10 mg via INTRAVENOUS

## 2020-12-18 MED ORDER — PROPOFOL 10 MG/ML IV BOLUS
INTRAVENOUS | Status: DC | PRN
  Administered 2020-12-18: 200 mg via INTRAVENOUS

## 2020-12-18 MED ORDER — ROCURONIUM BROMIDE 100 MG/10ML IV SOLN
INTRAVENOUS | Status: DC | PRN
  Administered 2020-12-18: 50 mg via INTRAVENOUS
  Administered 2020-12-18: 10 mg via INTRAVENOUS

## 2020-12-18 MED ORDER — MIDAZOLAM HCL 2 MG/2ML IJ SOLN
INTRAMUSCULAR | Status: DC | PRN
  Administered 2020-12-18: 2 mg via INTRAVENOUS

## 2020-12-18 SURGICAL SUPPLY — 38 items
ADAPTER IRRIG TUBE 2 SPIKE SOL (ADAPTER) ×6 IMPLANT
ADPR TBG 2 SPK PMP STRL ASCP (ADAPTER) ×2
BAG DRN LRG CPC RND TRDRP CNTR (MISCELLANEOUS) ×1
BAG URO DRAIN 4000ML (MISCELLANEOUS) ×3 IMPLANT
CATH FOLEY 3WAY 30CC 24FR (CATHETERS) ×3
CATH URETL 5X70 OPEN END (CATHETERS) ×3 IMPLANT
CATH URTH STD 24FR FL 3W 2 (CATHETERS) ×1 IMPLANT
CONTAINER COLLECT MORCELLATR (MISCELLANEOUS) ×1 IMPLANT
DRAPE UTILITY 15X26 TOWEL STRL (DRAPES) IMPLANT
ELECT BIVAP BIPO 22/24 DONUT (ELECTROSURGICAL)
ELECTRD BIVAP BIPO 22/24 DONUT (ELECTROSURGICAL) IMPLANT
FIBER LASER FLEXIVA PULSE 550 (Laser) ×3 IMPLANT
FILTER OVERFLOW MORCELLATOR (FILTER) ×1 IMPLANT
GLOVE BIOGEL PI IND STRL 7.5 (GLOVE) ×1 IMPLANT
GLOVE BIOGEL PI INDICATOR 7.5 (GLOVE) ×2
GOWN STRL REUS W/ TWL LRG LVL3 (GOWN DISPOSABLE) ×1 IMPLANT
GOWN STRL REUS W/ TWL XL LVL3 (GOWN DISPOSABLE) ×1 IMPLANT
GOWN STRL REUS W/TWL LRG LVL3 (GOWN DISPOSABLE) ×3
GOWN STRL REUS W/TWL XL LVL3 (GOWN DISPOSABLE) ×3
HOLDER FOLEY CATH W/STRAP (MISCELLANEOUS) ×3 IMPLANT
KIT TURNOVER CYSTO (KITS) ×3 IMPLANT
MBRN O SEALING YLW 17 FOR INST (MISCELLANEOUS) ×3
MEMBRANE SLNG YLW 17 FOR INST (MISCELLANEOUS) ×1 IMPLANT
MORCELLATOR COLLECT CONTAINER (MISCELLANEOUS) ×3
MORCELLATOR OVERFLOW FILTER (FILTER) ×3
MORCELLATOR ROTATION 4.75 335 (MISCELLANEOUS) ×3 IMPLANT
PACK CYSTO AR (MISCELLANEOUS) ×3 IMPLANT
SET CYSTO W/LG BORE CLAMP LF (SET/KITS/TRAYS/PACK) ×3 IMPLANT
SET IRRIG Y TYPE TUR BLADDER L (SET/KITS/TRAYS/PACK) ×3 IMPLANT
SLEEVE PROTECTION STRL DISP (MISCELLANEOUS) ×6 IMPLANT
SOL .9 NS 3000ML IRR  AL (IV SOLUTION) ×24
SOL .9 NS 3000ML IRR AL (IV SOLUTION) ×8
SOL .9 NS 3000ML IRR UROMATIC (IV SOLUTION) ×8 IMPLANT
SURGILUBE 2OZ TUBE FLIPTOP (MISCELLANEOUS) ×3 IMPLANT
SYR TOOMEY IRRIG 70ML (MISCELLANEOUS) ×3
SYRINGE TOOMEY IRRIG 70ML (MISCELLANEOUS) ×1 IMPLANT
TUBE PUMP MORCELLATOR PIRANHA (TUBING) ×3 IMPLANT
WATER STERILE IRR 1000ML POUR (IV SOLUTION) ×3 IMPLANT

## 2020-12-18 NOTE — OR Nursing (Signed)
Dr. Tish Men in to see pt when first arrived to postop.

## 2020-12-18 NOTE — Anesthesia Postprocedure Evaluation (Signed)
Anesthesia Post Note  Patient: Kurt [name]  Procedure(s) Performed: HOLEP-LASER ENUCLEATION OF THE PROSTATE WITH MORCELLATION (N/A )  Patient location during evaluation: PACU Anesthesia Type: General Level of consciousness: awake and alert Pain management: pain level controlled Vital Signs Assessment: post-procedure vital signs reviewed and stable Respiratory status: spontaneous breathing, nonlabored ventilation, respiratory function stable and patient connected to nasal cannula oxygen Cardiovascular status: blood pressure returned to baseline and stable Postop Assessment: no apparent nausea or vomiting Anesthetic complications: no   No complications documented.   Last Vitals:  Vitals:   12/18/20 1415 12/18/20 1427  BP:  (!) 147/88  Pulse:  79  Resp:  18  Temp: 36.6 C 36.6 C  SpO2:  99%    Last Pain:  Vitals:   12/18/20 1427  TempSrc: Temporal  PainSc: 5                  Phill Mutter

## 2020-12-18 NOTE — Interval H&P Note (Signed)
UROLOGY H&P UPDATE  Agree with prior H&P dated 12/13/20  Cardiac: RRR Lungs: CTA bilaterally  Laterality: N/A Procedure: HoLEP  Urine: UA 12/15 benign  We discussed the risks and benefits of HoLEP at length.  The procedure requires general anesthesia and takes 2 to 3 hours, and a holmium laser is used to enucleate the prostate and push this tissue into the bladder.  A morcellator is then used to remove this tissue, which is sent for pathology.  The vast majority of patients are able to discharge the same day with a catheter in place for 2 to 3 days, and will follow-up in clinic for a voiding trial.  Approximately 5% of patients will be admitted overnight to monitor the urine, or if they have multiple co-morbidities.  We specifically discussed the risks of bleeding, infection, retrograde ejaculation, temporary urgency and urge incontinence, very low risk of long-term incontinence, pathologic evaluation of prostate tissue and possible detection of prostate cancer or other malignancy, and possible need for additional procedures.   Billey Co, MD 12/18/2020

## 2020-12-18 NOTE — Anesthesia Preprocedure Evaluation (Signed)
Anesthesia Evaluation  Patient identified by MRN, date of birth, ID band Patient awake    Reviewed: Allergy & Precautions, Patient's Chart, lab work & pertinent test results  History of Anesthesia Complications (+) DIFFICULT AIRWAY and history of anesthetic complications  Airway Mallampati: IV  TM Distance: >3 FB Neck ROM: Limited  Mouth opening: Limited Mouth Opening  Dental no notable dental hx.    Pulmonary sleep apnea and Continuous Positive Airway Pressure Ventilation , former smoker,    Pulmonary exam normal        Cardiovascular negative cardio ROS Normal cardiovascular exam     Neuro/Psych  Headaches,    GI/Hepatic negative GI ROS, Neg liver ROS,   Endo/Other  negative endocrine ROS  Renal/GU negative Renal ROS     Musculoskeletal negative musculoskeletal ROS (+)   Abdominal Normal abdominal exam  (+)   Peds  Hematology negative hematology ROS (+)   Anesthesia Other Findings   Reproductive/Obstetrics                             Anesthesia Physical Anesthesia Plan  ASA: II  Anesthesia Plan: General   Post-op Pain Management:    Induction: Intravenous  PONV Risk Score and Plan: 2 and Ondansetron  Airway Management Planned: Oral ETT and Video Laryngoscope Planned  Additional Equipment:   Intra-op Plan:   Post-operative Plan:   Informed Consent: I have reviewed the patients History and Physical, chart, labs and discussed the procedure including the risks, benefits and alternatives for the proposed anesthesia with the patient or authorized representative who has indicated his/her understanding and acceptance.       Plan Discussed with:   Anesthesia Plan Comments:         Anesthesia Quick Evaluation

## 2020-12-18 NOTE — Discharge Instructions (Addendum)
Indwelling Urinary Catheter Care, Adult An indwelling urinary catheter is a thin tube that is put into your bladder. The tube helps to drain pee (urine) out of your body. The tube goes in through your urethra. Your urethra is where pee comes out of your body. Your pee will come out through the catheter, then it will go into a bag (drainage bag). Take good care of your catheter so it will work well. How to wear your catheter and bag Supplies needed  Sticky tape (adhesive tape) or a leg strap.  Alcohol wipe or soap and water (if you use tape).  A clean towel (if you use tape).  Large overnight bag.  Smaller bag (leg bag). Wearing your catheter Attach your catheter to your leg with tape or a leg strap.  Make sure the catheter is not pulled tight.  If a leg strap gets wet, take it off and put on a dry strap.  If you use tape to hold the bag on your leg: 1. Use an alcohol wipe or soap and water to wash your skin where the tape made it sticky before. 2. Use a clean towel to pat-dry that skin. 3. Use new tape to make the bag stay on your leg. Wearing your bags You should have been given a large overnight bag.  You may wear the overnight bag in the day or night.  Always have the overnight bag lower than your bladder.  Do not let the bag touch the floor.  Before you go to sleep, put a clean plastic bag in a wastebasket. Then hang the overnight bag inside the wastebasket. You should also have a smaller leg bag that fits under your clothes.  Always wear the leg bag below your knee.  Do not wear your leg bag at night. How to care for your skin and catheter Supplies needed  A clean washcloth.  Water and mild soap.  A clean towel. Caring for your skin and catheter      Clean the skin around your catheter every day: 1. Wash your hands with soap and water. 2. Wet a clean washcloth in warm water and mild soap. 3. Clean the skin around your urethra.  If you are  male:  Gently spread the folds of skin around your vagina (labia).  With the washcloth in your other hand, wipe the inner side of your labia on each side. Wipe from front to back.  If you are male:  Pull back any skin that covers the end of your penis (foreskin).  With the washcloth in your other hand, wipe your penis in small circles. Start wiping at the tip of your penis, then move away from the catheter.  Move the foreskin back in place, if needed. 4. With your free hand, hold the catheter close to where it goes into your body.  Keep holding the catheter during cleaning so it does not get pulled out. 5. With the washcloth in your other hand, clean the catheter.  Only wipe downward on the catheter.  Do not wipe upward toward your body. Doing this may push germs into your urethra and cause infection. 6. Use a clean towel to pat-dry the catheter and the skin around it. Make sure to wipe off all soap. 7. Wash your hands with soap and water.  Shower every day. Do not take baths.  Do not use cream, ointment, or lotion on the area where the catheter goes into your body, unless your doctor tells you   to.  Do not use powders, sprays, or lotions on your genital area.  Check your skin around the catheter every day for signs of infection. Check for: ? Redness, swelling, or pain. ? Fluid or blood. ? Warmth. ? Pus or a bad smell. How to empty the bag Supplies needed  Rubbing alcohol.  Gauze pad or cotton ball.  Tape or a leg strap. Emptying the bag Pour the pee out of your bag when it is ?- full, or at least 2-3 times a day. Do this for your overnight bag and your leg bag. 1. Wash your hands with soap and water. 2. Separate (detach) the bag from your leg. 3. Hold the bag over the toilet or a clean pail. Keep the bag lower than your hips and bladder. This is so the pee (urine) does not go back into the tube. 4. Open the pour spout. It is at the bottom of the bag. 5. Empty the  pee into the toilet or pail. Do not let the pour spout touch any surface. 6. Put rubbing alcohol on a gauze pad or cotton ball. 7. Use the gauze pad or cotton ball to clean the pour spout. 8. Close the pour spout. 9. Attach the bag to your leg with tape or a leg strap. 10. Wash your hands with soap and water. Follow instructions for cleaning the drainage bag:  From the product maker.  As told by your doctor. How to change the bag Supplies needed  Alcohol wipes.  A clean bag.  Tape or a leg strap. Changing the bag Replace your bag when it starts to leak, smell bad, or look dirty. 1. Wash your hands with soap and water. 2. Separate the dirty bag from your leg. 3. Pinch the catheter with your fingers so that pee does not spill out. 4. Separate the catheter tube from the bag tube where these tubes connect (at the connection valve). Do not let the tubes touch any surface. 5. Clean the end of the catheter tube with an alcohol wipe. Use a different alcohol wipe to clean the end of the bag tube. 6. Connect the catheter tube to the tube of the clean bag. 7. Attach the clean bag to your leg with tape or a leg strap. Do not make the bag tight on your leg. 8. Wash your hands with soap and water. General rules   Never pull on your catheter. Never try to take it out. Doing that can hurt you.  Always wash your hands before and after you touch your catheter or bag. Use a mild, fragrance-free soap. If you do not have soap and water, use hand sanitizer.  Always make sure there are no twists or bends (kinks) in the catheter tube.  Always make sure there are no leaks in the catheter or bag.  Drink enough fluid to keep your pee pale yellow.  Do not take baths, swim, or use a hot tub.  If you are male, wipe from front to back after you poop (have a bowel movement). Contact a doctor if:  Your pee is cloudy.  Your pee smells worse than usual.  Your catheter gets clogged.  Your catheter  leaks.  Your bladder feels full. Get help right away if:  You have redness, swelling, or pain where the catheter goes into your body.  You have fluid, blood, pus, or a bad smell coming from the area where the catheter goes into your body.  Your skin feels warm where   the catheter goes into your body.  You have a fever.  You have pain in your: ? Belly (abdomen). ? Legs. ? Lower back. ? Bladder.  You see blood in the catheter.  Your pee is pink or red.  You feel sick to your stomach (nauseous).  You throw up (vomit).  You have chills.  Your pee is not draining into the bag.  Your catheter gets pulled out. Summary  An indwelling urinary catheter is a thin tube that is placed into the bladder to help drain pee (urine) out of the body.  The catheter is placed into the part of the body that drains pee from the bladder (urethra).  Taking good care of your catheter will keep it working properly and help prevent problems.  Always wash your hands before and after touching your catheter or bag.  Never pull on your catheter or try to take it out. This information is not intended to replace advice given to you by your health care provider. Make sure you discuss any questions you have with your health care provider. Document Revised: 04/09/2019 Document Reviewed: 08/01/2017 Elsevier Patient Education  2020 [place]   1) The drugs that you were given will stay in your system until tomorrow so for the next 24 hours you should not:  A) Drive an automobile B) Make any legal decisions C) Drink any alcoholic beverage   2) You may resume regular meals tomorrow.  Today it is better to start with liquids and gradually work up to solid foods.  You may eat anything you prefer, but it is better to start with liquids, then soup and crackers, and gradually work up to solid foods.   3) Please notify your doctor immediately if you  have any unusual bleeding, trouble breathing, redness and pain at the surgery site, drainage, fever, or pain not relieved by medication.    4) Additional Instructions:        Please contact your physician with any problems or Same Day Surgery at [phone], Monday through Friday 6 am to 4 pm, or [facility] number at [phone].

## 2020-12-18 NOTE — Transfer of Care (Signed)
Immediate Anesthesia Transfer of Care Note  Patient: Celvin Taney  Procedure(s) Performed: HOLEP-LASER ENUCLEATION OF THE PROSTATE WITH MORCELLATION (N/A )  Patient Location: PACU  Anesthesia Type:General  Level of Consciousness: awake, drowsy and patient cooperative  Airway & Oxygen Therapy: Patient Spontanous Breathing  Post-op Assessment: Report given to RN and Post -op Vital signs reviewed and stable  Post vital signs: Reviewed and stable  Last Vitals:  Vitals Value Taken Time  BP 155/106 12/18/20 1318  Temp    Pulse 95 12/18/20 1318  Resp 14 12/18/20 1318  SpO2 97 % 12/18/20 1318  Vitals shown include unvalidated device data.  Last Pain:  Vitals:   12/18/20 1018  TempSrc: Oral  PainSc: 0-No pain         Complications: No complications documented.

## 2020-12-18 NOTE — Op Note (Signed)
Date of procedure: 12/18/20  Preoperative diagnosis:  1. BPH with obstruction  Postoperative diagnosis:  1. Same  Procedure: 1. HoLEP (Holmium Laser Enucleation of the Prostate)  Surgeon: Nickolas Madrid, MD  Anesthesia: General  Complications: None  Intraoperative findings:  1. Moderate size prostate with obstructing lateral lobes 2. Uncomplicated HOLEP, ureteral orifices and verumontanum intact at conclusion of case  EBL: Minimal  Specimens: Prostate chips  Enucleation time: 30 minutes  Morcellation time: 15 minutes  Intra-op weight: 50g*  Drains: 24 French three-way, 60 cc in balloon  Indication: Kurt [name] is a 64 y.o. patient with BPH and bothersome urinary symptoms despite maximal medical therapy.  After reviewing the management options for treatment, they elected to proceed with the above surgical procedure(s). We have discussed the potential benefits and risks of the procedure, side effects of the proposed treatment, the likelihood of the patient achieving the goals of the procedure, and any potential problems that might occur during the procedure or recuperation.  We specifically discussed the risks of bleeding, infection, hematuria and clot retention, need for additional procedures, possible overnight hospital stay, temporary urgency and incontinence, rare long-term incontinence, and retrograde ejaculation.  Informed consent has been obtained.   Description of procedure:  The patient was taken to the operating room and general anesthesia was induced.  The patient was placed in the dorsal lithotomy position, prepped and draped in the usual sterile fashion, and preoperative antibiotics were administered.  SCDs were placed for DVT prophylaxis.  A preoperative time-out was performed.   [name] sounds were used to gently dilated the urethra up to 69F. The 3 French continuous flow resectoscope was inserted into the urethra using the visual obturator  The prostate was  moderate in size with obstructing lateral lobes. The bladder was thoroughly inspected and notable for mild to moderate trabeculations, but no suspicious lesions.  The ureteral orifices were located in orthotopic position.  The laser was set to 2 J and 50 Hz and was used to make a lambda incision just proximal to the verumontanum down to the level of the capsule.  A 6 o'clock incision was then made down to the level of the capsule from the bladder neck to the verumontanum.  The lateral lobes were then incised circumferentially until they were disconnected from the surrounding tissue.  The capsule was examined and laser was used for meticulous hemostasis.    The 45 French resectoscope was then switched out for the 10 French nephroscope and the lobes were morcellated and the tissue sent to pathology.  A 24 French three-way catheter was inserted easily, and 60cc were placed in the balloon.  Urine was faint pink.  The catheter irrigated easily with a Toomey syringe.  CBI was initiated. A belladonna suppository was placed.  The patient tolerated the procedure well without any immediate complications and was extubated and transferred to the recovery room in stable condition.  Urine was clear on fast CBI.  Disposition: Stable to PACU  Plan: Wean CBI in PACU, anticipate discharge home today with void trial in clinic in 2-3 days Renal US ordered for completion of microscopic hematuria workup  Nickolas Madrid, MD 12/18/2020

## 2020-12-18 NOTE — Anesthesia Procedure Notes (Signed)
Procedure Name: Intubation Performed by: Kelton Pillar, CRNA Pre-anesthesia Checklist: Patient identified, Emergency Drugs available, Suction available and Patient being monitored Patient Re-evaluated:Patient Re-evaluated prior to induction Oxygen Delivery Method: Circle system utilized Preoxygenation: Pre-oxygenation with 100% oxygen Induction Type: IV induction and Cricoid Pressure applied Ventilation: Mask ventilation without difficulty Laryngoscope Size: McGraph and 4 Grade View: Grade II Tube type: Oral Tube size: 7.0 mm Number of attempts: 1 Airway Equipment and Method: Stylet and Oral airway Placement Confirmation: ETT inserted through vocal cords under direct vision,  positive ETCO2,  breath sounds checked- equal and bilateral and CO2 detector Secured at: 21 cm Tube secured with: Tape Dental Injury: Teeth and Oropharynx as per pre-operative assessment

## 2020-12-18 NOTE — OR Nursing (Signed)
ENUCLEATION BEGAN AT 1203 AND ENDED AT 1230, MORCELLATION BEGAN AT 1241 AND ENDED AT 1300

## 2020-12-19 ENCOUNTER — Encounter: Payer: Self-pay | Admitting: Urology

## 2020-12-19 NOTE — Progress Notes (Signed)
Catheter Removal  Patient is s/p HoLEP with Dr. Diamantina Providence on 12/18/2020.  RUS pending to complete hematuria work up.   Patient is present today for a catheter removal.   60 ml of water was drained from the balloon. A 24 FR 3 -way foley cath was removed from the bladder no complications were noted . Patient tolerated well.  Performed by: Zara Council, PA-C  Follow up/ Additional notes: He is to return on 02/28/2021 for his appointment with Dr. Diamantina Providence.

## 2020-12-20 ENCOUNTER — Other Ambulatory Visit: Payer: Self-pay

## 2020-12-20 ENCOUNTER — Ambulatory Visit: Payer: BC Managed Care – PPO | Admitting: Family Medicine

## 2020-12-20 ENCOUNTER — Ambulatory Visit: Payer: BC Managed Care – PPO | Admitting: Urology

## 2020-12-20 ENCOUNTER — Ambulatory Visit (INDEPENDENT_AMBULATORY_CARE_PROVIDER_SITE_OTHER): Payer: BC Managed Care – PPO | Admitting: Urology

## 2020-12-20 DIAGNOSIS — N401 Enlarged prostate with lower urinary tract symptoms: Secondary | ICD-10-CM

## 2020-12-20 DIAGNOSIS — N138 Other obstructive and reflux uropathy: Secondary | ICD-10-CM

## 2020-12-20 LAB — BLADDER SCAN AMB NON-IMAGING: Scan Result: 127

## 2020-12-21 LAB — SURGICAL PATHOLOGY

## 2020-12-26 ENCOUNTER — Telehealth: Payer: Self-pay

## 2020-12-26 NOTE — Telephone Encounter (Signed)
Called pt no answer. Unable to leave message as no  DPR is on file. 1st attempt.  

## 2020-12-26 NOTE — Telephone Encounter (Signed)
-----   Message from Sondra Come, MD sent at 12/26/2020  8:19 AM EST ----- Randie Heinz news, no prostate cancer seen on tissue from HoLEP, just BPH, follow up as scheduled  Legrand Rams, MD 12/26/2020

## 2021-01-01 NOTE — Telephone Encounter (Signed)
Called pt no answer. Unable to leave message, no DPR. 2nd attempt.

## 2021-01-16 NOTE — Progress Notes (Signed)
I, Peterson Lombard, LAT, ATC acting as a scribe for Lynne Leader, MD.  Kurt [name] is a 65 y.o. male who presents to [place] at [facility] today for f/u c-spine pain and stiffness and for c-spine MRI review. Pt was last seen by Dr. Georgina Snell on 11/20/20 and was advised to obtain a MRI and referred to PT at [facility] of which he's completed 5-6 visits. Today, pt reports neck is feeling slightly better and was wondering how much longer he could continue PT.  Radiating pain: No UE numbness/tingling: No UE weakness: No Aggravates: stress/tension, R cervical rotation and B sidebending Rx tried: massage, PT, dry needling  Diagnostic imaging: C-spine MRI- 12/09/20  Pertinent review of systems: No fevers or chills  Relevant historical information: Recent prostate laser surgery due to BPH.  Biopsy negative.   Exam:  BP 136/88 (BP Location: Right Arm, Patient Position: Sitting, Cuff Size: Normal)   Pulse 87   Ht 6\' 2"  (1.88 m)   Wt 252 lb (114.3 kg)   SpO2 97%   BMI 32.35 kg/m  General: Well Developed, well nourished, and in no acute distress.   MSK: C-spine normal-appearing nontender decreased cervical motion.    Lab and Radiology Results MR CERVICAL SPINE WO CONTRAST  Result Date: 12/10/2020 CLINICAL DATA:  Neck pain, chronic, degenerative changes on xray EXAM: MRI CERVICAL SPINE WITHOUT CONTRAST TECHNIQUE: Multiplanar, multisequence MR imaging of the cervical spine was performed. No intravenous contrast was administered. COMPARISON:  None. FINDINGS: Alignment: Straightening of lordosis. Sequela of fusion at the C6-T1 levels. Vertebrae: Normal bone marrow signal intensity. No focal osseous lesion. Cord: Normal signal and morphology. Posterior Fossa, vertebral arteries: Negative. Disc levels: Multilevel desiccation. C2-3: No significant disc bulge. Patent spinal canal and neural foramen. Minimal facet degenerative spurring. C3-4: Small disc osteophyte complex  with central protrusion abutting the ventral cord. Uncovertebral and facet hypertrophy. Mild spinal canal and bilateral neural foraminal narrowing. C4-5: Small disc osteophyte complex with central protrusion abutting the ventral cord. Uncovertebral and facet degenerative spurring. Prominent ligamentum flavum. Mild spinal canal and bilateral neural foraminal narrowing. C5-6: Disc osteophyte complex with shallow central protrusion. Uncovertebral and facet degenerative spurring. Patent spinal canal. Mild bilateral neural foraminal narrowing. C6-7: Minimal uncovertebral and facet degenerative spurring. Patent spinal canal and right neural foramen. Mild left neural foraminal narrowing. C7-T1: No significant disc bulge. Patent spinal canal and neural foramen. Paraspinal tissues: Negative. IMPRESSION: Multilevel spondylosis.  Sequela of fusion at the C6-T1 levels. Mild C3-5 spinal canal and bilateral neural foraminal narrowing. Mild bilateral C5-6 and left C6-7 neural foraminal narrowing. Electronically Signed   By: Primitivo Gauze M.D.   On: 12/10/2020 09:38   I, Lynne Leader, personally (independently) visualized and performed the interpretation of the images attached in this note.     Assessment and Plan: 65 y.o. male with neck pain without radiculopathy.  Patient does have spondylosis and central and neuroforaminal stenosis.  He does have a little bit of facet DJD as well.  However he does not have any obvious targets for facet injection or other interventions at this time.  I believe his best options are to finish out physical therapy and proceed with home exercise program and further conservative management.  I have prescribed tizanidine for use occasionally.  We also discussed Cymbalta and he declined which I think is reasonable as he notes that he is not a good medicine taker.   Discussed that for exacerbations of his pain physical therapy is probably  his first-line treatment and happy to reorder that  in the future.  Could consider injections into the future including an epidural steroid injection or even possibly facet injections however hopeful and optimistic that these would not be necessary.  Additionally set expectations for pain.  Expect that he will always have some chronic pain but as he has experienced improvement in pain with physical therapy I think it is reasonable that the continued improvement that he has will last. . Recheck as needed.  PDMP not reviewed this encounter. No orders of the defined types were placed in this encounter.  Meds ordered this encounter  Medications  . tiZANidine (ZANAFLEX) 4 MG tablet    Sig: Take 1 tablet (4 mg total) by mouth every 6 (six) hours as needed for muscle spasms.    Dispense:  30 tablet    Refill:  1     Discussed warning signs or symptoms. Please see discharge instructions. Patient expresses understanding.   The above documentation has been reviewed and is accurate and complete Lynne Leader, M.D.   Total encounter time 20 minutes including face-to-face time with the patient and, reviewing past medical record, and charting on the date of service.

## 2021-01-17 ENCOUNTER — Other Ambulatory Visit: Payer: Self-pay

## 2021-01-17 ENCOUNTER — Ambulatory Visit: Payer: BC Managed Care – PPO | Admitting: Family Medicine

## 2021-01-17 VITALS — BP 136/88 | HR 87 | Ht 74.0 in | Wt 252.0 lb

## 2021-01-17 DIAGNOSIS — M542 Cervicalgia: Secondary | ICD-10-CM | POA: Diagnosis not present

## 2021-01-17 DIAGNOSIS — M436 Torticollis: Secondary | ICD-10-CM | POA: Diagnosis not present

## 2021-01-17 MED ORDER — TIZANIDINE HCL 4 MG PO TABS: 4.0000 mg | ORAL_TABLET | Freq: Four times a day (QID) | ORAL | 1 refills | Status: AC | PRN

## 2021-01-17 NOTE — Patient Instructions (Addendum)
Thank you for coming in today.  Plan to maximize conservative management with home exercises, home stretching, and finishing course of PT and in the future occasional PT for episodes.   Use the muscles relaxer for bad days. (Tizanidine)  Use the heating pad and massager as needed.   Recheck with me as needed.

## 2021-01-18 ENCOUNTER — Ambulatory Visit
Admission: RE | Admit: 2021-01-18 | Discharge: 2021-01-18 | Disposition: A | Discharge status: Home / Self Care | Payer: BC Managed Care – PPO | Source: Ambulatory Visit | Attending: Urology | Admitting: Urology

## 2021-01-18 ENCOUNTER — Other Ambulatory Visit: Payer: Self-pay

## 2021-01-18 DIAGNOSIS — R3129 Other microscopic hematuria: Secondary | ICD-10-CM | POA: Diagnosis not present

## 2021-01-18 IMAGING — US US RENAL
1 series · 13 of 25 positions shown · non-contrast
Comparison: None

CLINICAL DATA: Microscopic hematuria

EXAM:
RENAL / URINARY TRACT ULTRASOUND COMPLETE

[Series 1: us renal · 0.25mm/px · 13 of 64 slices shown]
[im 1/64]
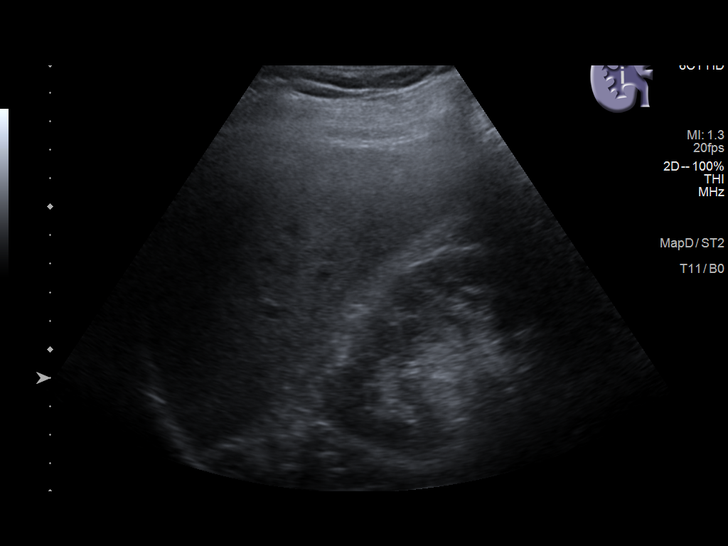
[im 6/64]
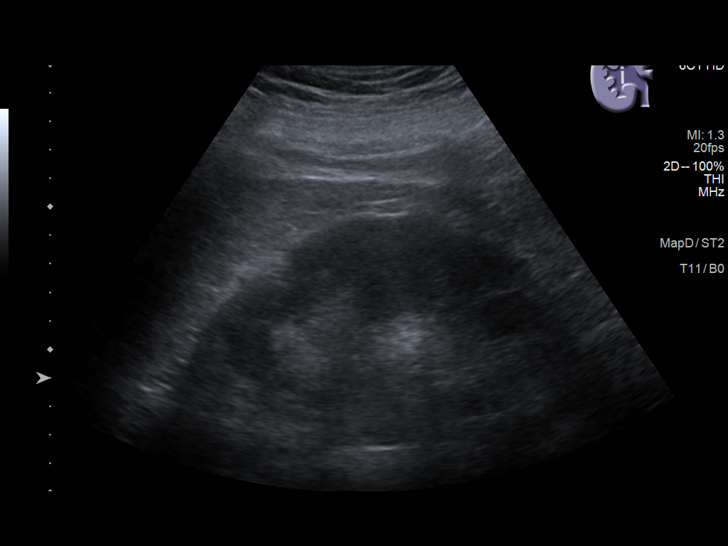
[im 11/64]
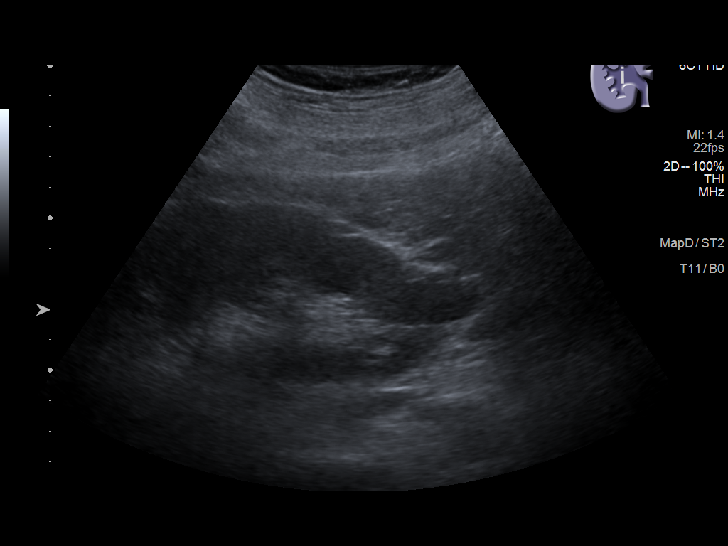
[im 16/64]
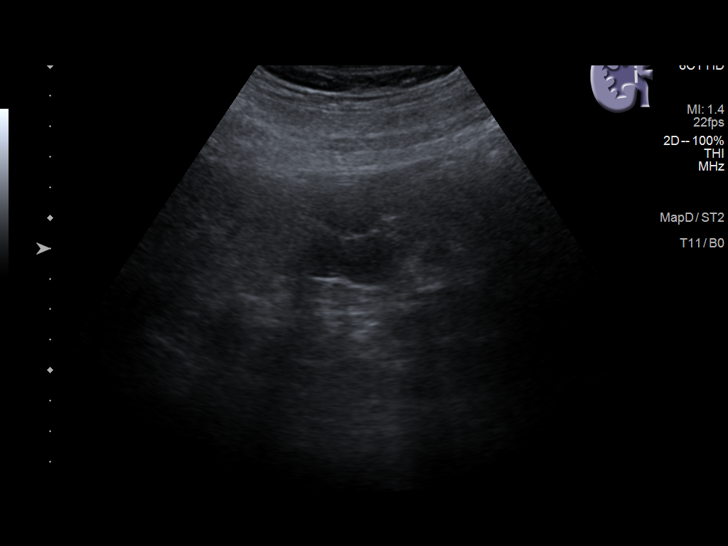
[im 22/64]
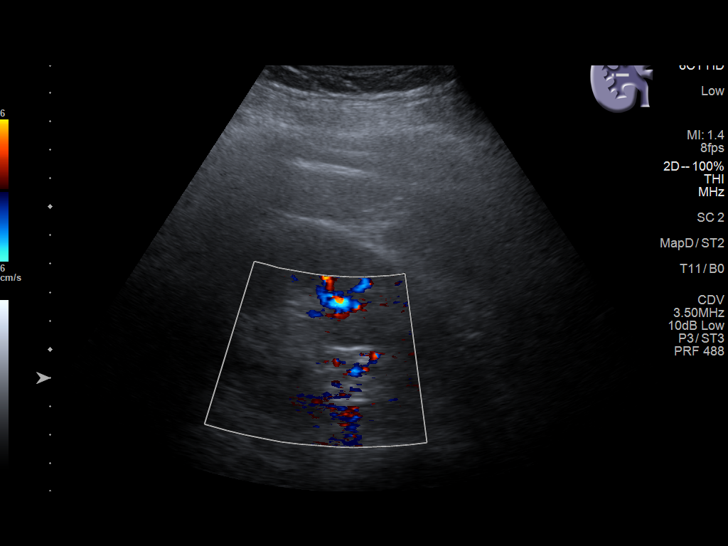
[im 27/64]
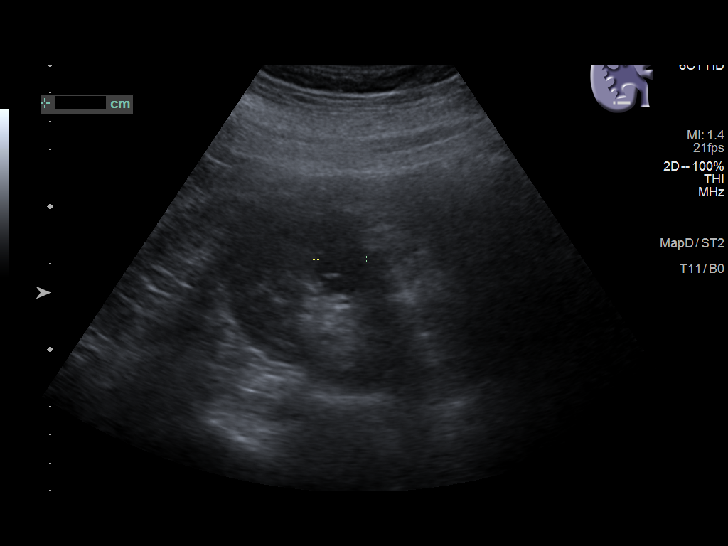
[im 32/64]
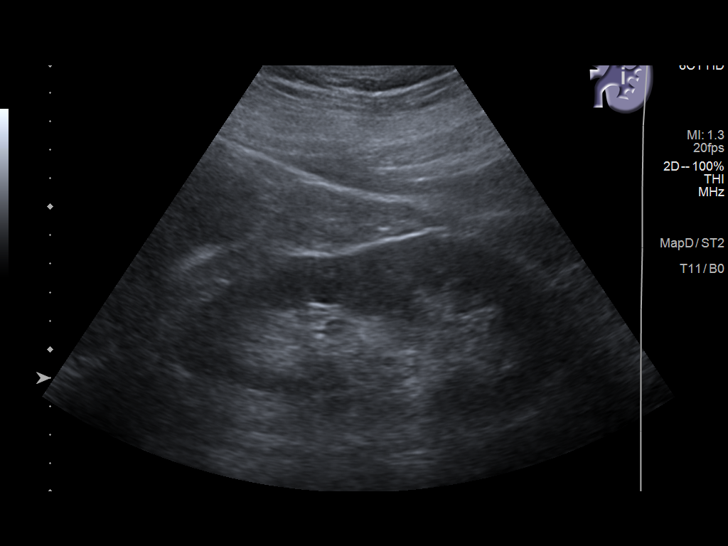
[im 37/64]
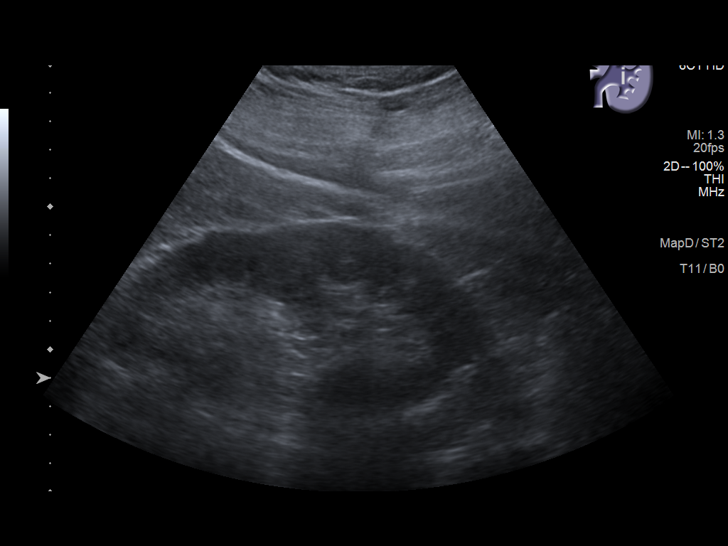
[im 43/64]
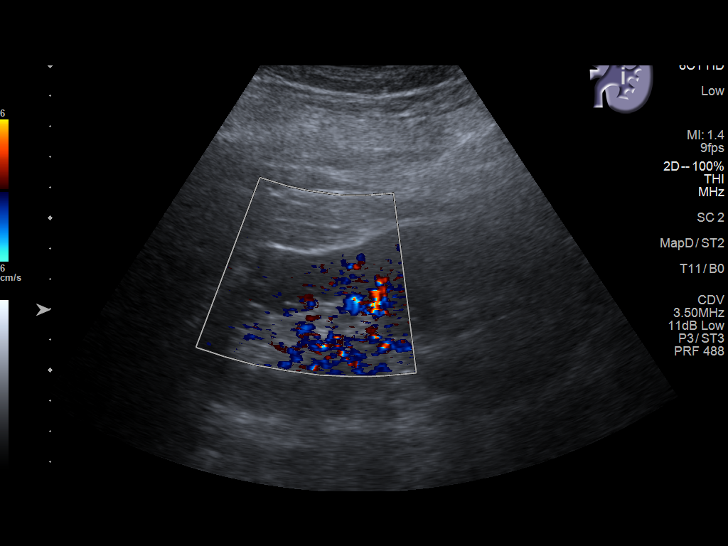
[im 48/64]
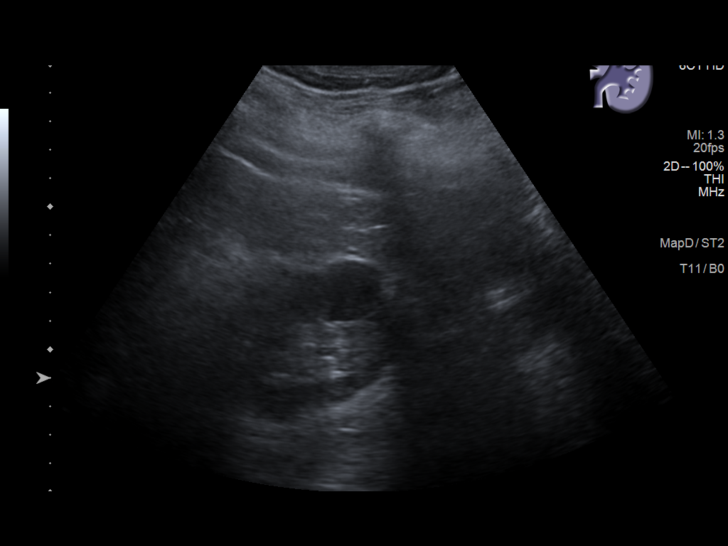
[im 53/64]
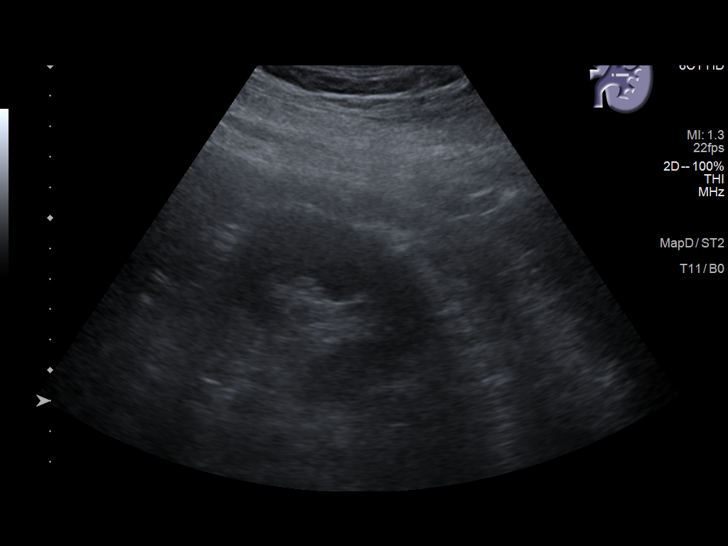
[im 58/64]
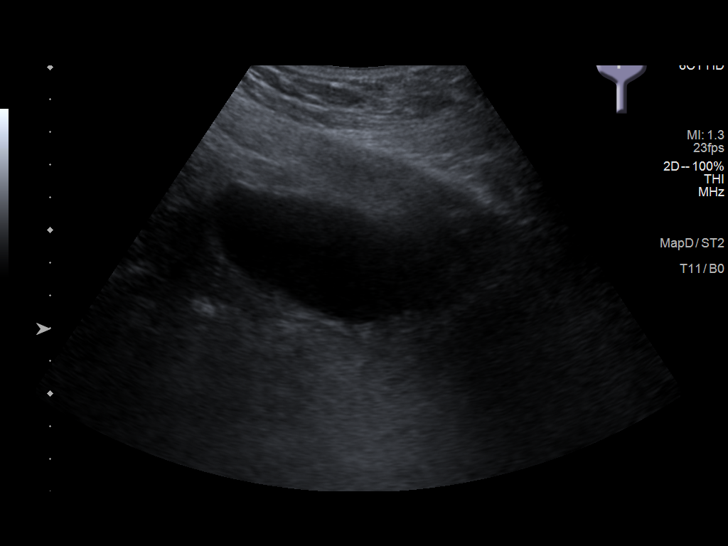
[im 64/64]
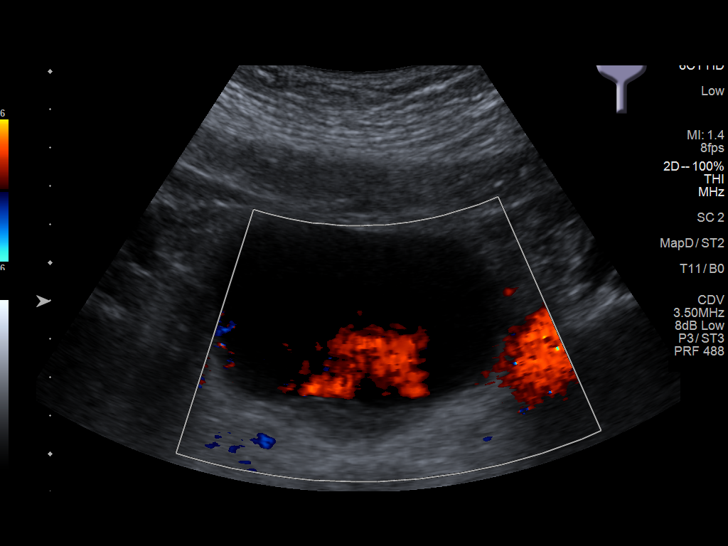

[13 of 25 positions shown; findings below may reference images not displayed]

FINDINGS: Right Kidney:

Renal measurements: 13.3 x 8.2 x 5.9 cm = volume: 339 mL. Normal
cortical thickness and echogenicity. Complex cyst at mid kidney
x 3.0 x 2.9 cm containing septations and internal echogenicity.
Additional small cyst at inferior pole 2.3 x 2.1 x 1.8 cm. Third
small inferior cyst 2.2 cm greatest size. No hydronephrosis or
shadowing calcifications.

Left Kidney:

Renal measurements: 14.1 x 6.1 x 6.1 cm = volume: 275 mL. Normal
cortical thickness and echogenicity. Questionable complicated cyst
versus solid lesion at upper pole 2.2 x 2.2 x 2.0 cm, contains some
heterogeneous low level internal echoes. No additional mass or
hydronephrosis. No shadowing calcifications.

Bladder:

Appears normal for degree of bladder distention. BILATERAL ureteral
jets visualized.

Other:

N/A
IMPRESSION: Complicated cyst at mid RIGHT kidney 3.2 cm greatest size with
additional simple cysts at inferior pole RIGHT kidney.

Questionable complicated cyst versus solid lesion at upper pole LEFT
kidney 2.2 cm greatest size.

Characterization of these lesions by MR imaging with and without
contrast recommended to exclude renal neoplasm(s).

## 2021-01-25 ENCOUNTER — Other Ambulatory Visit: Payer: Self-pay

## 2021-01-25 ENCOUNTER — Ambulatory Visit (INDEPENDENT_AMBULATORY_CARE_PROVIDER_SITE_OTHER): Payer: BC Managed Care – PPO | Admitting: Urology

## 2021-01-25 ENCOUNTER — Encounter: Payer: Self-pay | Admitting: Urology

## 2021-01-25 DIAGNOSIS — N2889 Other specified disorders of kidney and ureter: Secondary | ICD-10-CM

## 2021-01-25 MED ORDER — DIAZEPAM 5 MG PO TABS: 10.0000 mg | ORAL_TABLET | Freq: Once | ORAL | 0 refills | Status: DC | PRN

## 2021-01-25 NOTE — Progress Notes (Signed)
   01/25/2021 5:13 PM   Cloyce Blankenhorn 1956-12-17 [id number]  Reason for visit: Follow up renal cysts  HPI: Mr. Borawski is a 65 year old male with BPH who underwent a uncomplicated HOLEP on 89/38/1017 for obstructive urinary symptoms.  He also had microscopic hematuria preop, and a follow-up renal ultrasound was ordered to complete evaluation.  He is here to review his renal ultrasound results.  From a HOLEP perspective he is doing well and voiding with a strong stream.  He is having some mild urgency and frequency that continue to improve.  He is also having some intermittent hematuria which is not surprising and only a month out from HoLEP.  I personally reviewed and interpreted the renal ultrasound on 01/19/2021 that shows a complex cyst at the mid right kidney measuring 3 cm as well as some additional simple cysts, and a possible complicated cyst for solid lesion at the upper pole of the left kidney measuring 2 cm in size.  Follow-up MRI is recommended for further evaluation.  We discussed possible etiology of these findings ranging from cysts that do not require further evaluation or follow-up, complex cyst that would require routine surveillance, or even a solid lesion that we would need to consider biopsy, close observation, or removal.  We discussed that after the MRI we will have much more information about the next steps.    We again reviewed standard postop expectations after HOLEP, and I anticipate his urinary symptoms will continue to improve over the next 1 to 2 months.  MRI abdomen pelvis ordered, will call with those results Keep scheduled follow-up in 1 month for standard HOLEP postop care  I spent 30 total minutes on the day of the encounter including pre-visit review of the medical record, face-to-face time with the patient, and post visit ordering of labs/imaging/tests.   Billey Co, [facility] [address], [place],  [place] [address] [phone]

## 2021-02-09 ENCOUNTER — Other Ambulatory Visit: Payer: Self-pay

## 2021-02-09 ENCOUNTER — Ambulatory Visit
Admission: RE | Admit: 2021-02-09 | Discharge: 2021-02-09 | Disposition: A | Discharge status: Home / Self Care | Payer: BC Managed Care – PPO | Source: Ambulatory Visit | Attending: Urology | Admitting: Urology

## 2021-02-09 DIAGNOSIS — N2889 Other specified disorders of kidney and ureter: Secondary | ICD-10-CM | POA: Insufficient documentation

## 2021-02-09 IMAGING — MR MR ABDOMEN WO/W CM
18 series · 48 of 48 positions shown · IV contrast (gadavist)
Comparison: Ultrasound [DATE]

CLINICAL DATA: Evaluate right renal mass seen on ultrasound

EXAM:
MRI ABDOMEN WITHOUT AND WITH CONTRAST
TECHNIQUE: Multiplanar multisequence MR imaging of the abdomen was performed
both before and after the administration of intravenous contrast.
CONTRAST:  10mL GADAVIST GADOBUTROL 1 MMOL/ML IV SOLN

[Series 2: T2 · coronal · 6.0mm · 1.19mm/px · 1 of 30 slices shown (1 of 2)]
[im 1/30]
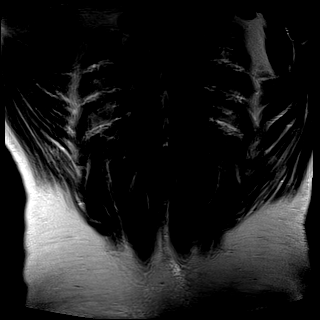

[Series 3: T2 · axial · 6.0mm · 1.19mm/px · 1 of 32 slices shown (2 of 2)]
[im 1/32]
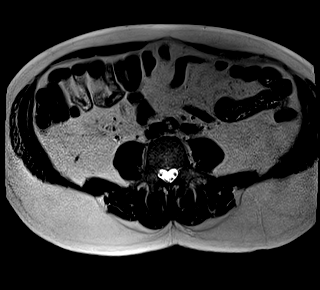

[Series 6: T2 fat-sat · axial · 6.0mm · 1.19mm/px · 1 of 38 slices shown]
[im 1/38]
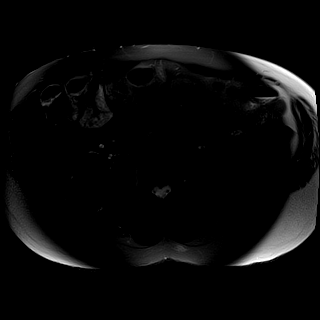

[Series 7: ax dwi_tracew · axial · 6.0mm · 1.42mm/px · z∈[-124,+141]mm · 3 of 114 slices shown]
[im 1/114]
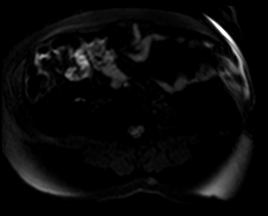
[im 57/114]
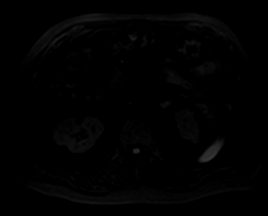
[im 114/114]
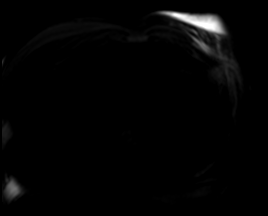

[Series 8: ax dwi_adc · axial · 6.0mm · 1.42mm/px · 1 of 38 slices shown]
[im 1/38]
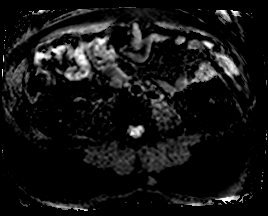

[Series 9: T1 · axial · 6.0mm · 0.74mm/px · 1 of 32 slices shown (1 of 2)]
[im 1/32]
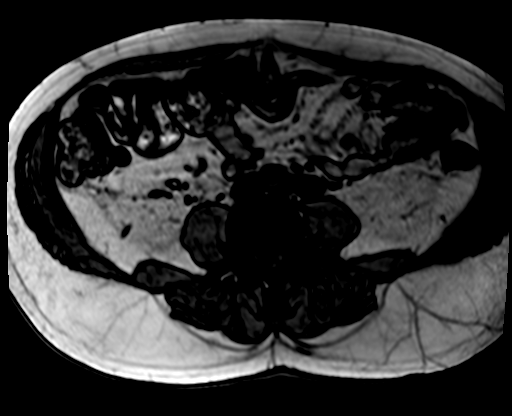

[Series 9: T1 · axial · 6.0mm · 0.74mm/px · 1 of 32 slices shown (2 of 2)]
[im 1/32]
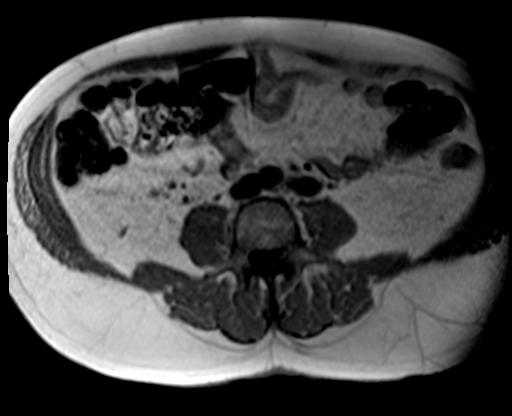

[Series 10: bSSFP · axial · 6.0mm · 0.74mm/px · 1 of 32 slices shown]
[im 1/32]
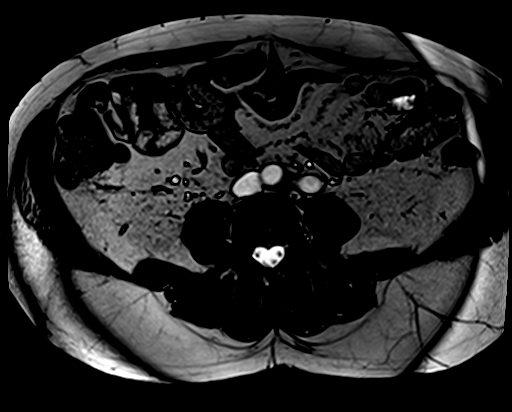

[Series 11: T1 dynamic fat-sat · axial · non-contrast · 3.0mm · 1.19mm/px · z∈[-130,+153]mm · 3 of 96 slices shown (1 of 5)]
[im 1/96]
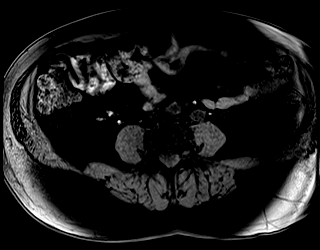
[im 48/96]
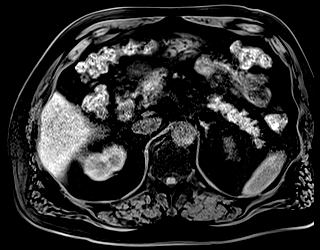
[im 96/96]
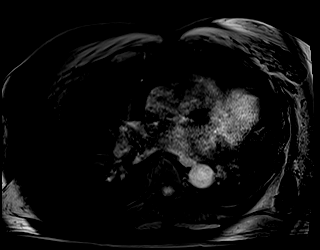

[Series 12: T1 dynamic fat-sat post-contrast · axial · 3.0mm · 1.19mm/px · z∈[-130,+153]mm · 4 of 96 slices shown (1 of 4)]
[im 1/96]
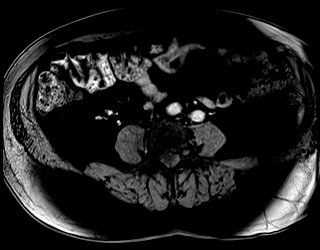
[im 32/96]
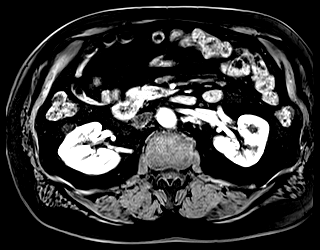
[im 64/96]
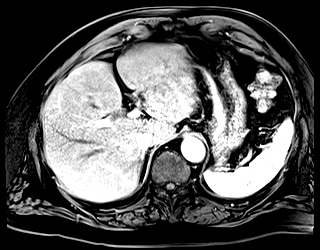
[im 96/96]
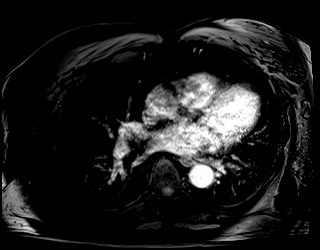

[Series 13: T1 dynamic fat-sat · axial · 3.0mm · 1.19mm/px · z∈[-130,+153]mm · 4 of 96 slices shown (2 of 5)]
[im 1/96]
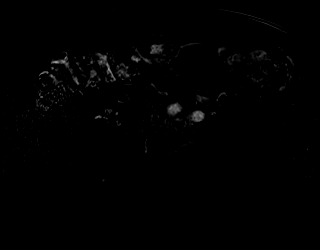
[im 32/96]
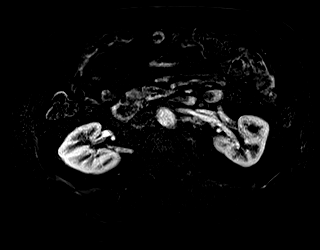
[im 64/96]
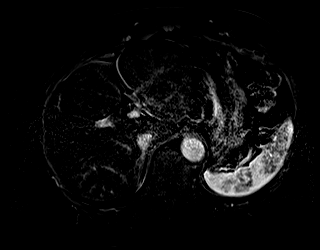
[im 96/96]
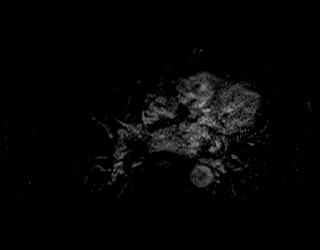

[Series 14: T1 dynamic fat-sat post-contrast · axial · 3.0mm · 1.19mm/px · z∈[-130,+153]mm · 4 of 96 slices shown (2 of 4)]
[im 1/96]
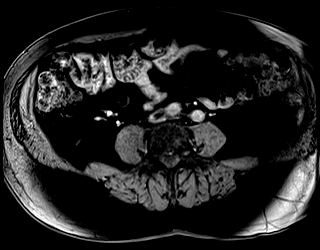
[im 32/96]
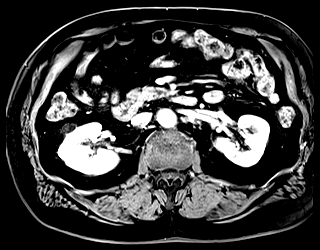
[im 64/96]
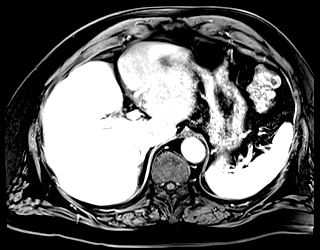
[im 96/96]
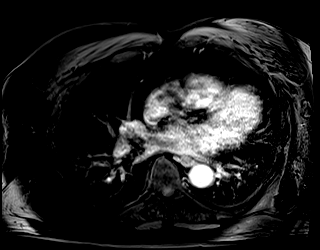

[Series 15: T1 dynamic fat-sat · axial · 3.0mm · 1.19mm/px · z∈[-130,+153]mm · 4 of 96 slices shown (3 of 5)]
[im 1/96]
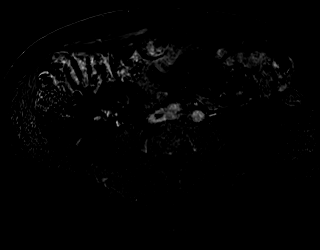
[im 32/96]
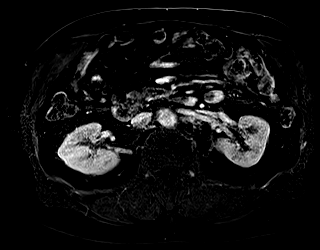
[im 64/96]
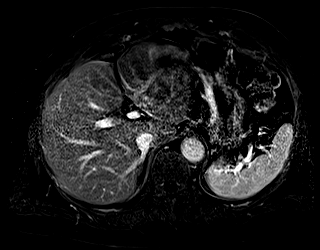
[im 96/96]
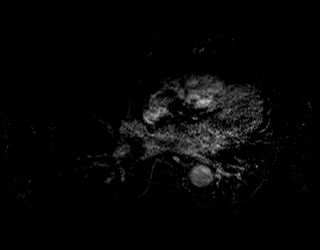

[Series 16: T1 dynamic fat-sat post-contrast · axial · 3.0mm · 1.19mm/px · z∈[-130,+153]mm · 4 of 96 slices shown (3 of 4)]
[im 1/96]
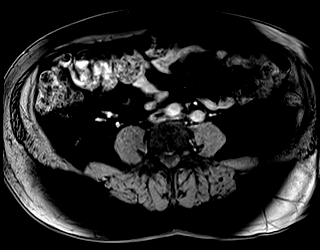
[im 32/96]
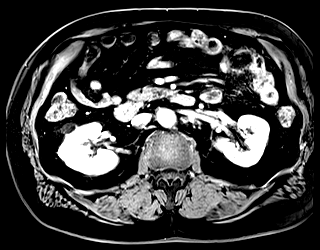
[im 64/96]
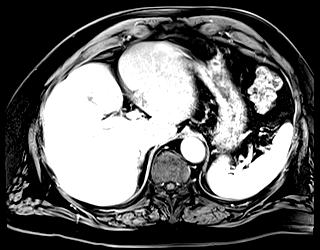
[im 96/96]
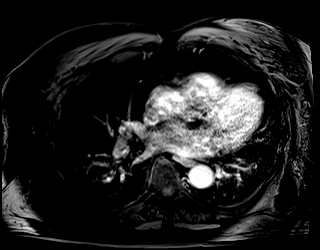

[Series 17: T1 dynamic fat-sat · axial · 3.0mm · 1.19mm/px · z∈[-130,+153]mm · 4 of 96 slices shown (4 of 5)]
[im 1/96]
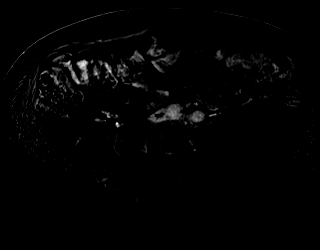
[im 32/96]
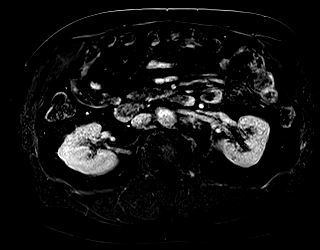
[im 64/96]
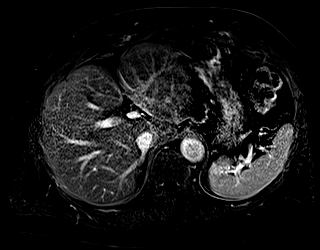
[im 96/96]
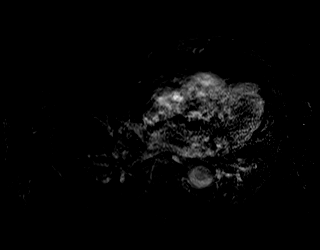

[Series 18: T1 dynamic post-contrast · coronal · 3.0mm · 1.31mm/px · 3 of 72 slices shown]
[im 1/72]
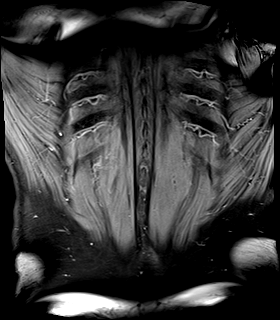
[im 36/72]
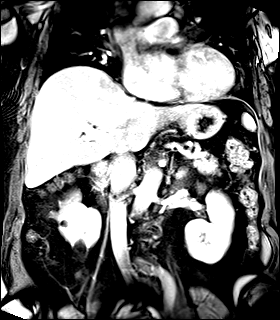
[im 72/72]
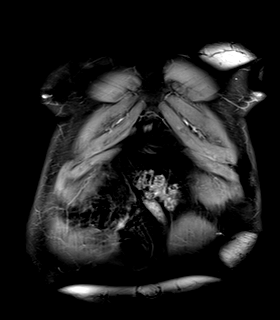

[Series 19: T1 dynamic fat-sat post-contrast · axial · 3.0mm · 1.19mm/px · z∈[-130,+153]mm · 4 of 96 slices shown (4 of 4)]
[im 1/96]
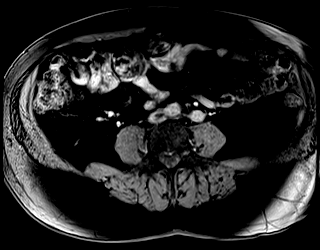
[im 32/96]
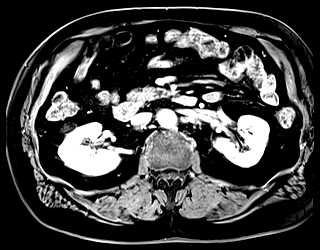
[im 64/96]
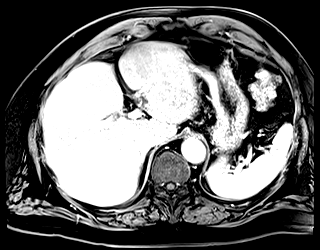
[im 96/96]
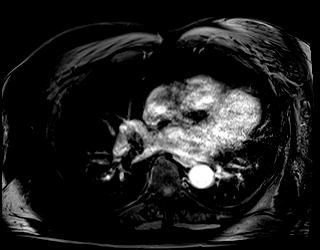

[Series 20: T1 dynamic fat-sat · axial · 3.0mm · 1.19mm/px · z∈[-130,+153]mm · 4 of 96 slices shown (5 of 5)]
[im 1/96]
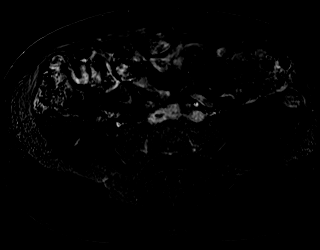
[im 32/96]
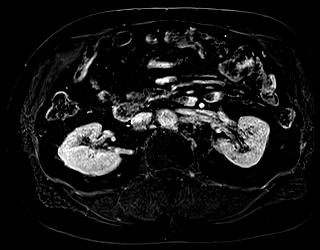
[im 64/96]
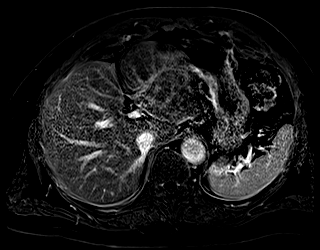
[im 96/96]
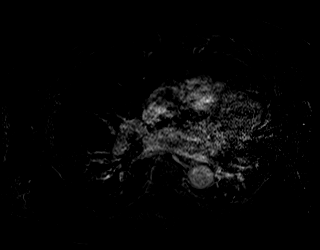

[48 of 48 positions shown; findings below may reference images not displayed]

FINDINGS: Lower chest: The lung bases are clear of an acute process. No
pleural or pericardial effusion.

Hepatobiliary: No hepatic lesions or intrahepatic biliary
dilatation. The gallbladder is surgically absent. No common bile
duct dilatation. Normal caliber and course of the common bile duct.

Pancreas:  No mass, inflammation or ductal dilatation.

Spleen:  Normal size.  No focal lesions.

Adrenals/Urinary Tract: 15 mm nonenhancing fatty lesion associated
with the right adrenal gland consistent with a benign myelolipoma.
The left adrenal gland is normal.

Bilateral septated renal cysts are noted. Cluster of cysts versus is
versus is multi septated cyst associated with the midpole region of
the right kidney laterally. This measures a maximum of 5.5 x 3.6 cm.
There is also a 3 cm septated cyst projecting off the lower pole
region of the right kidney and a 2.1 cm cyst projecting off the
lateral cortex of the left kidney in the upper pole region. I do not
see any worrisome contrast enhancement. No solid renal lesions. The
diffusion-weighted images are negative.

Stomach/Bowel: The stomach, duodenum, visualized small bowel and
visualized colon are grossly normal.

Vascular/Lymphatic: The aorta and branch vessels are patent. No
aneurysm or dissection. The major venous structures are patent. No
mesenteric or retroperitoneal mass or adenopathy.

Other:  No ascites or abdominal wall hernia.

Musculoskeletal: No significant bony findings.
IMPRESSION: 1. Bilateral septated renal cysts. I would consider the largest
cluster of cysts or multi septated cyst as a Bosniak 2F lesion and
would recommend a follow-up MR examination in 1 year. No solid renal
lesions.
2. 15 mm right adrenal gland myelolipoma.
3. No acute abdominal findings or adenopathy.

## 2021-02-09 MED ORDER — GADOBUTROL 1 MMOL/ML IV SOLN
10.0000 mL | Freq: Once | INTRAVENOUS | Status: AC | PRN
  Administered 2021-02-09: 10 mL via INTRAVENOUS

## 2021-02-28 ENCOUNTER — Encounter: Payer: Self-pay | Admitting: Urology

## 2021-02-28 ENCOUNTER — Other Ambulatory Visit: Payer: Self-pay

## 2021-02-28 ENCOUNTER — Ambulatory Visit: Payer: BC Managed Care – PPO | Admitting: Urology

## 2021-02-28 VITALS — BP 143/91 | HR 71 | Ht 74.0 in | Wt 254.0 lb

## 2021-02-28 DIAGNOSIS — N138 Other obstructive and reflux uropathy: Secondary | ICD-10-CM | POA: Diagnosis not present

## 2021-02-28 DIAGNOSIS — N401 Enlarged prostate with lower urinary tract symptoms: Secondary | ICD-10-CM | POA: Diagnosis not present

## 2021-02-28 DIAGNOSIS — N281 Cyst of kidney, acquired: Secondary | ICD-10-CM

## 2021-02-28 DIAGNOSIS — R972 Elevated prostate specific antigen [PSA]: Secondary | ICD-10-CM

## 2021-02-28 LAB — BLADDER SCAN AMB NON-IMAGING

## 2021-02-28 NOTE — Progress Notes (Signed)
   02/28/2021 10:09 AM   Kurt [name] [birth year] [id number]  Reason for visit: Follow up BPH status post HoLEP and renal cysts  HPI: I saw [name] back in clinic for the above issues.  Briefly, 65 year old male with longstanding BPH and elevated PSA who underwent an uncomplicated HOLEP on 54/98/2641.  Pathology showed only benign tissue, he has not yet had a follow-up PSA.  He also had microscopic hematuria preop, and underwent a renal ultrasound that showed some complex renal cyst.  A follow-up MRI was ordered, and I personally reviewed and interpreted the MRI from 02/10/2021 showing Bosniak 56F lesion in the right kidney and no solid lesions, follow-up MRI in 1 year was recommended.  In terms of his urinary symptoms he is doing well and voiding with a strong stream.  He still has occasional mild stress incontinence, but this continues to improve.  Expectations reviewed and encouraged Kegel exercises.   RTC 6 months with PSA prior, IPSS and PVR  Billey Co, MD  [facility] [address] [place], [place] [address] [phone]

## 2021-02-28 NOTE — Patient Instructions (Signed)

## 2021-08-22 LAB — EXTERNAL GENERIC LAB PROCEDURE: COLOGUARD: NEGATIVE

## 2021-08-31 ENCOUNTER — Other Ambulatory Visit: Payer: Self-pay

## 2021-08-31 ENCOUNTER — Other Ambulatory Visit: Payer: BC Managed Care – PPO

## 2021-08-31 DIAGNOSIS — N401 Enlarged prostate with lower urinary tract symptoms: Secondary | ICD-10-CM

## 2021-08-31 DIAGNOSIS — N138 Other obstructive and reflux uropathy: Secondary | ICD-10-CM

## 2021-09-01 LAB — CBC WITH DIFFERENTIAL/PLATELET
Basophils Absolute: 0 10*3/uL (ref 0.0–0.2)
Basos: 0 %
EOS (ABSOLUTE): 0.1 10*3/uL (ref 0.0–0.4)
Eos: 2 %
Hematocrit: 40.2 % (ref 37.5–51.0)
Hemoglobin: 13.3 g/dL (ref 13.0–17.7)
Immature Grans (Abs): 0 10*3/uL (ref 0.0–0.1)
Immature Granulocytes: 0 %
Lymphocytes Absolute: 3.2 10*3/uL — ABNORMAL HIGH (ref 0.7–3.1)
Lymphs: 50 %
MCH: 29 pg (ref 26.6–33.0)
MCHC: 33.1 g/dL (ref 31.5–35.7)
MCV: 88 fL (ref 79–97)
Monocytes Absolute: 0.5 10*3/uL (ref 0.1–0.9)
Monocytes: 8 %
Neutrophils Absolute: 2.6 10*3/uL (ref 1.4–7.0)
Neutrophils: 40 %
Platelets: 287 10*3/uL (ref 150–450)
RBC: 4.59 x10E6/uL (ref 4.14–5.80)
RDW: 12.3 % (ref 11.6–15.4)
WBC: 6.5 10*3/uL (ref 3.4–10.8)

## 2021-09-01 LAB — PSA: Prostate Specific Ag, Serum: 0.6 ng/mL (ref 0.0–4.0)

## 2021-09-05 ENCOUNTER — Ambulatory Visit: Payer: BC Managed Care – PPO | Admitting: Urology

## 2021-09-05 ENCOUNTER — Encounter: Payer: Self-pay | Admitting: Urology

## 2021-09-05 ENCOUNTER — Other Ambulatory Visit: Payer: Self-pay

## 2021-09-05 VITALS — BP 137/88 | HR 73 | Ht 73.0 in | Wt 247.0 lb

## 2021-09-05 DIAGNOSIS — N138 Other obstructive and reflux uropathy: Secondary | ICD-10-CM

## 2021-09-05 DIAGNOSIS — N281 Cyst of kidney, acquired: Secondary | ICD-10-CM

## 2021-09-05 DIAGNOSIS — N529 Male erectile dysfunction, unspecified: Secondary | ICD-10-CM

## 2021-09-05 DIAGNOSIS — N401 Enlarged prostate with lower urinary tract symptoms: Secondary | ICD-10-CM | POA: Diagnosis not present

## 2021-09-05 LAB — BLADDER SCAN AMB NON-IMAGING

## 2021-09-05 MED ORDER — DIAZEPAM 5 MG PO TABS: 5.0000 mg | ORAL_TABLET | Freq: Once | ORAL | 0 refills | Status: DC | PRN

## 2021-09-05 MED ORDER — TADALAFIL 10 MG PO TABS: 10.0000 mg | ORAL_TABLET | Freq: Every day | ORAL | 3 refills | Status: DC | PRN

## 2021-09-05 NOTE — Progress Notes (Signed)
   09/05/2021 9:21 AM   Kurt [name] 1956-06-23 [id number]  Reason for visit: Follow up BPH status post HOLEP, PSA screening, renal cysts, ED  HPI: 65 year old male with longstanding BPH and history of elevated PSA to 6.5 with negative biopsy who underwent an uncomplicated HOLEP in December 2021 with pathology showing 40 g of benign tissue.  He is doing extremely well and urinating with a strong stream, he is not having any further incontinence or urgency.  PVR is normal today at 22 mL.  PSA September 2022 dropped appropriately to 0.6.  He also had microscopic hematuria and renal ultrasound showed some complex renal cysts, which prompted MRI in February 2022 and showed bilateral cysts, with the largest on the right side being considered a Bosniak 50F cyst in 1 year MRI follow-up was recommended.  He denies any gross hematuria.  He is on Cialis 10 mg as needed for ED which is working well.  He needs a new prescription, and prefers [facility].  Cialis 10 mg as needed  MRI abdomen pelvis for Bosniak 50F cyst February 2023 RTC February/March 2023 to discuss [place], MD  [facility] [address], [place], [place] [address] [phone]

## 2022-01-24 ENCOUNTER — Telehealth: Payer: Self-pay | Admitting: Oncology

## 2022-01-24 NOTE — Telephone Encounter (Signed)
Patient has been scheduled for his NP appt. Aware of appt date,time, location and instructions.

## 2022-02-05 ENCOUNTER — Other Ambulatory Visit: Payer: Self-pay

## 2022-02-05 ENCOUNTER — Ambulatory Visit
Admission: RE | Admit: 2022-02-05 | Discharge: 2022-02-05 | Disposition: A | Discharge status: Home / Self Care | Payer: BC Managed Care – PPO | Source: Ambulatory Visit | Attending: Urology | Admitting: Urology

## 2022-02-05 DIAGNOSIS — N281 Cyst of kidney, acquired: Secondary | ICD-10-CM | POA: Diagnosis present

## 2022-02-05 IMAGING — MR MR ABDOMEN WO/W CM
17 series · 48 of 48 positions shown · IV contrast (gadavist)
Comparison: [DATE]

CLINICAL DATA: Follow-up cystic renal lesions.

EXAM:
MRI ABDOMEN WITHOUT AND WITH CONTRAST
TECHNIQUE: Multiplanar multisequence MR imaging of the abdomen was performed
both before and after the administration of intravenous contrast.
CONTRAST:  10mL GADAVIST GADOBUTROL 1 MMOL/ML IV SOLN

[Series 3: T2 · coronal · 6.0mm · 1.19mm/px · 2 of 30 slices shown (1 of 2)]
[im 1/30]
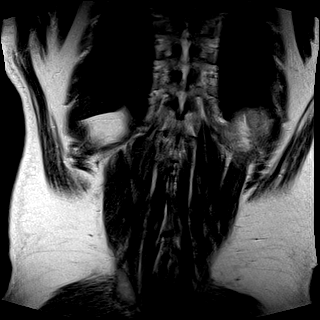
[im 30/30]
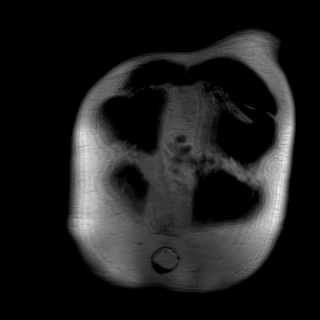

[Series 4: T2 · axial · 6.0mm · 1.19mm/px · z∈[-105,+119]mm · 2 of 32 slices shown (2 of 2)]
[im 1/32]
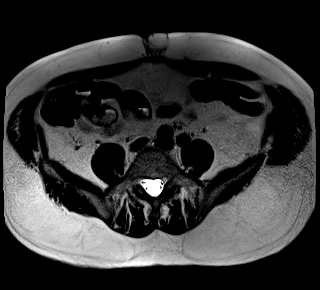
[im 32/32]
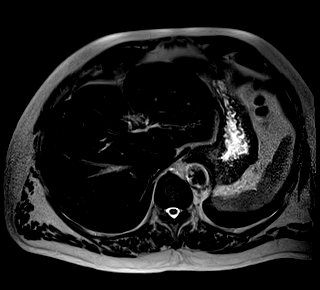

[Series 5: T1 · axial · 3.0mm · 1.19mm/px · z∈[-100,+113]mm · 4 of 72 slices shown (1 of 2)]
[im 1/72]
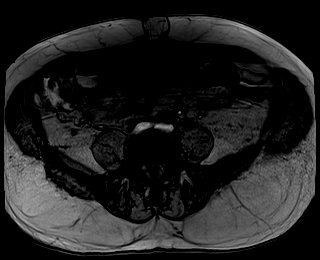
[im 24/72]
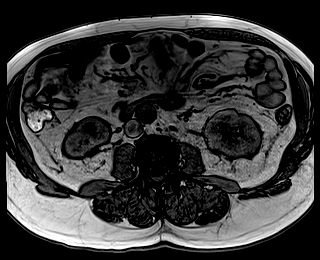
[im 48/72]
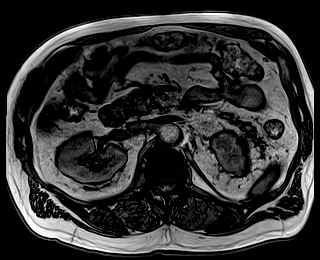
[im 72/72]
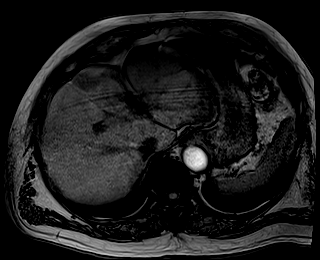

[Series 6: T1 · axial · 3.0mm · 1.19mm/px · z∈[-100,+113]mm · 4 of 72 slices shown (2 of 2)]
[im 1/72]
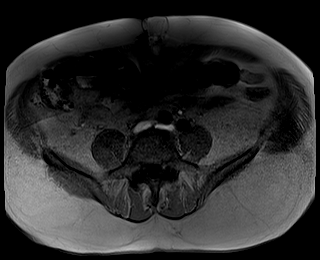
[im 24/72]
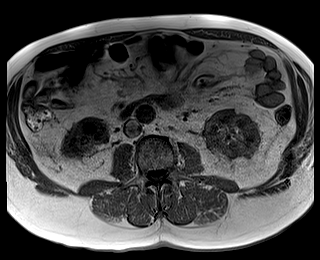
[im 48/72]
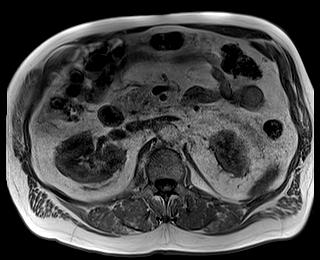
[im 72/72]
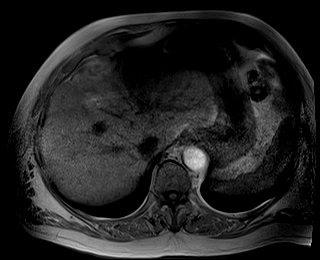

[Series 11: T2 fat-sat · axial · 6.0mm · 1.19mm/px · 1 of 30 slices shown]
[im 1/30]
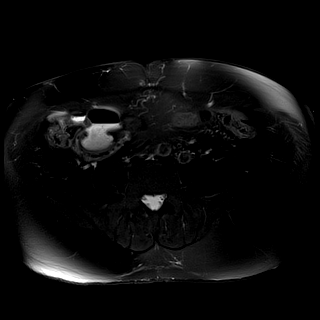

[Series 12: ax dwi_tracew · axial · 6.0mm · 1.42mm/px · z∈[-97,+111]mm · 4 of 90 slices shown]
[im 1/90]
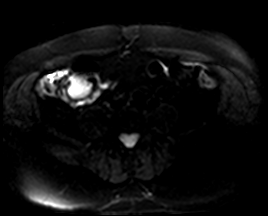
[im 30/90]
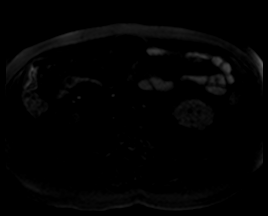
[im 60/90]
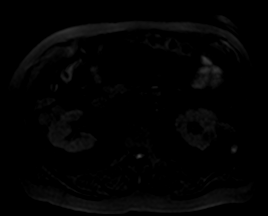
[im 90/90]
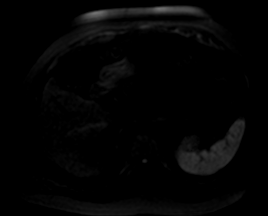

[Series 13: ax dwi_adc · axial · 6.0mm · 1.42mm/px · 1 of 30 slices shown]
[im 1/30]
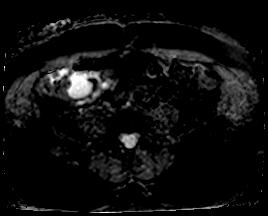

[Series 14: T1 dynamic fat-sat · axial · non-contrast · 3.0mm · 1.19mm/px · z∈[-100,+113]mm · 3 of 72 slices shown (1 of 5)]
[im 1/72]
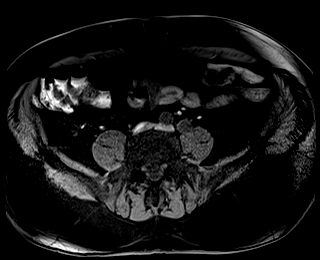
[im 36/72]
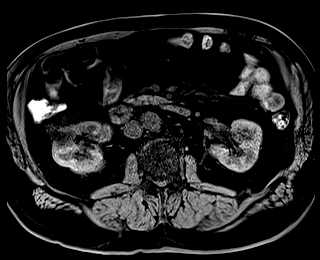
[im 72/72]
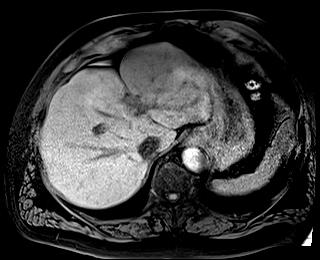

[Series 15: T1 dynamic fat-sat post-contrast · axial · 3.0mm · 1.19mm/px · z∈[-100,+113]mm · 3 of 72 slices shown (1 of 4)]
[im 1/72]
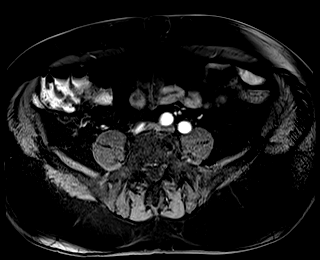
[im 36/72]
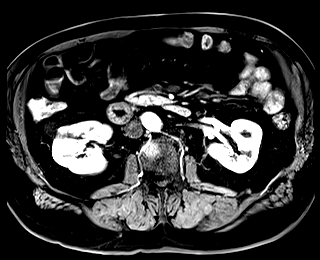
[im 72/72]
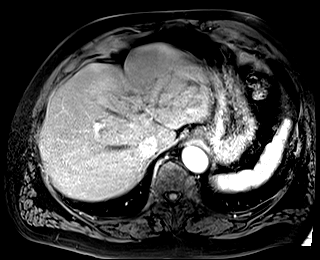

[Series 16: T1 dynamic fat-sat · axial · 3.0mm · 1.19mm/px · z∈[-100,+113]mm · 3 of 72 slices shown (2 of 5)]
[im 1/72]
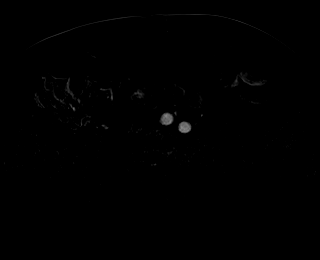
[im 36/72]
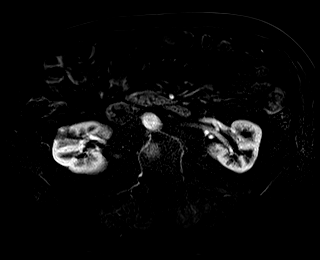
[im 72/72]
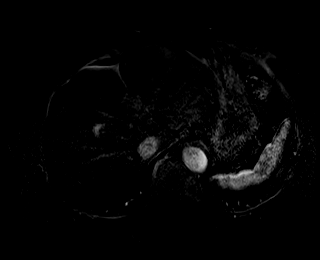

[Series 17: T1 dynamic fat-sat post-contrast · axial · 3.0mm · 1.19mm/px · z∈[-100,+113]mm · 3 of 72 slices shown (2 of 4)]
[im 1/72]
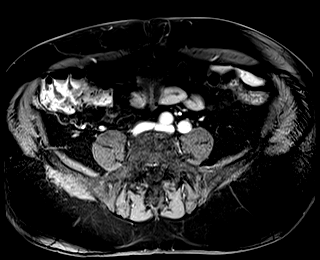
[im 36/72]
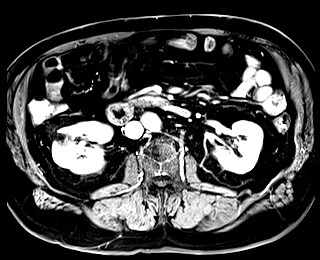
[im 72/72]
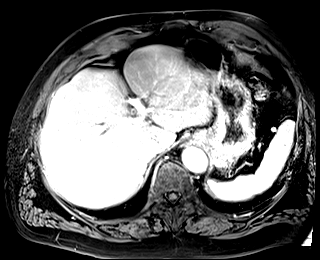

[Series 18: T1 dynamic fat-sat · axial · 3.0mm · 1.19mm/px · z∈[-100,+113]mm · 3 of 72 slices shown (3 of 5)]
[im 1/72]
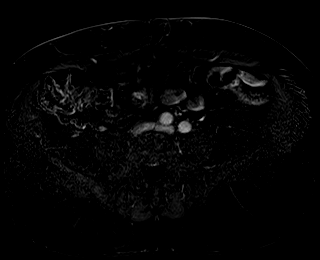
[im 36/72]
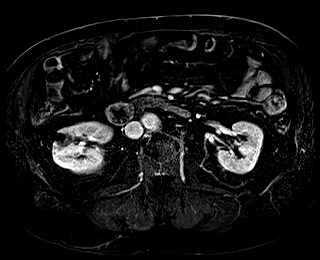
[im 72/72]
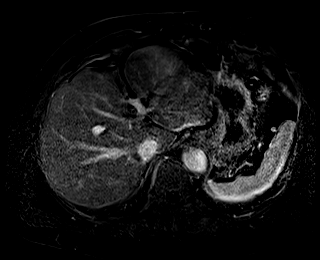

[Series 19: T1 dynamic fat-sat post-contrast · axial · 3.0mm · 1.19mm/px · z∈[-100,+113]mm · 3 of 72 slices shown (3 of 4)]
[im 1/72]
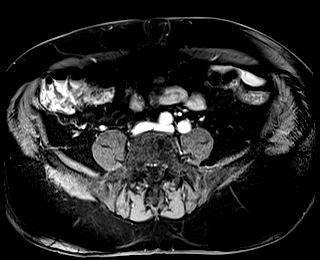
[im 36/72]
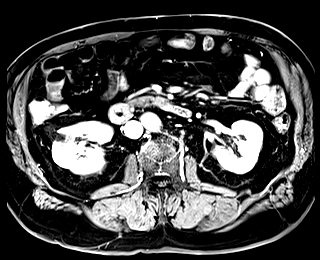
[im 72/72]
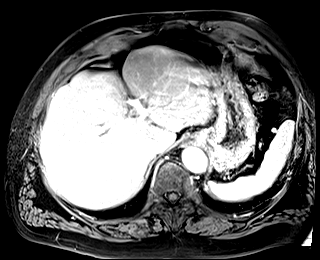

[Series 20: T1 dynamic fat-sat · axial · 3.0mm · 1.19mm/px · z∈[-100,+113]mm · 3 of 72 slices shown (4 of 5)]
[im 1/72]
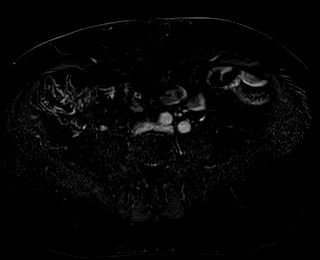
[im 36/72]
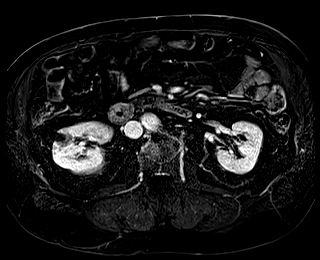
[im 72/72]
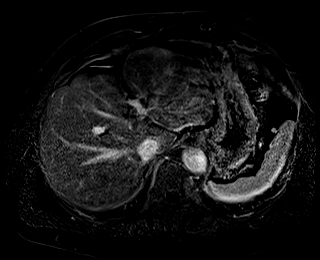

[Series 21: T1 dynamic post-contrast · coronal · 3.0mm · 1.31mm/px · 3 of 72 slices shown]
[im 1/72]
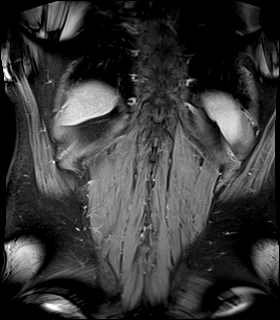
[im 36/72]
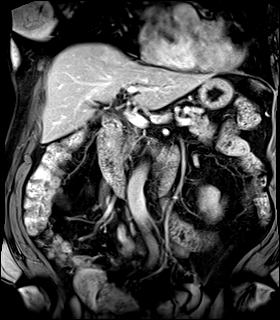
[im 72/72]
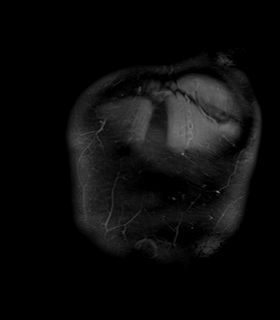

[Series 22: T1 dynamic fat-sat post-contrast · axial · 3.0mm · 1.19mm/px · z∈[-100,+113]mm · 3 of 72 slices shown (4 of 4)]
[im 1/72]
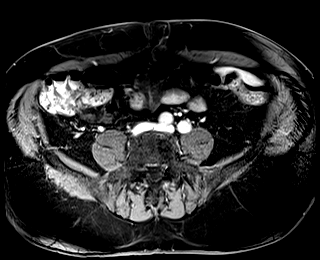
[im 36/72]
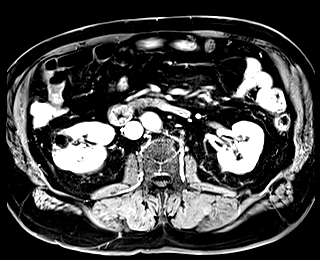
[im 72/72]
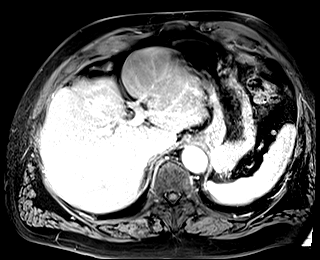

[Series 23: T1 dynamic fat-sat · axial · 3.0mm · 1.19mm/px · z∈[-100,+113]mm · 3 of 72 slices shown (5 of 5)]
[im 1/72]
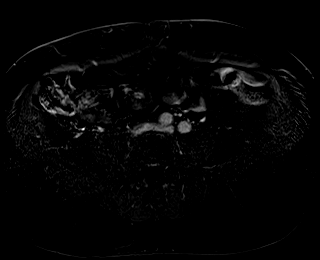
[im 36/72]
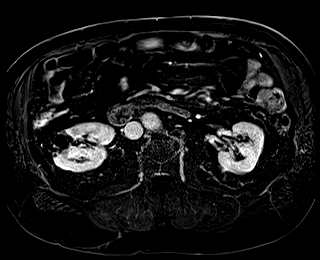
[im 72/72]
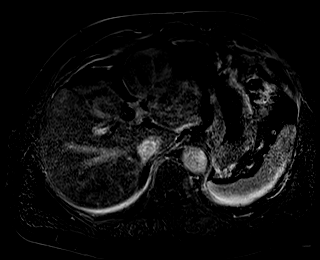

[48 of 48 positions shown; findings below may reference images not displayed]

FINDINGS: Lower chest: No acute findings.

Hepatobiliary: No hepatic masses identified. Prior cholecystectomy.
No evidence of biliary obstruction.

Pancreas:  No mass or inflammatory changes.

Spleen:  Within normal limits in size and appearance.

Adrenals/Urinary Tract: A 1.9 cm fat signal intensity right adrenal
mass remains stable, consistent with a benign myelolipoma. Several
complex cystic lesions are seen in the right kidney, which contain
multiple thin internal septations. Largest measures 3.1 cm on image
[DATE]. These remains stable since previous study, there is no
evidence of thickened septations, mural nodules, or other enhancing
soft tissue component. These are consistent with probably benign
Bosniak category 2 F cysts. No evidence of solid renal mass or
hydronephrosis.

Stomach/Bowel: Unremarkable.

Vascular/Lymphatic: No pathologically enlarged lymph nodes
identified. No acute vascular findings.

Other:  Small umbilical hernia is seen which contains only fat.

Musculoskeletal:  No suspicious bone lesions identified.
IMPRESSION: Stable probably benign Bosniak category 2F cysts in right kidney.
Continued follow-up by MRI is recommended again in 1 year. This
recommendation follows ACR consensus guidelines: Management of the
Incidental Renal Mass on CT: A White Paper of the ACR Incidental
Findings Committee. [HOSPITAL] [4C];[DATE].

Stable benign right adrenal myelolipoma.

Small umbilical hernia which contains only fat.

## 2022-02-05 MED ORDER — GADOBUTROL 1 MMOL/ML IV SOLN
10.0000 mL | Freq: Once | INTRAVENOUS | Status: AC | PRN
  Administered 2022-02-05: 10 mL via INTRAVENOUS

## 2022-02-06 ENCOUNTER — Other Ambulatory Visit: Payer: Self-pay | Admitting: Oncology

## 2022-02-06 ENCOUNTER — Ambulatory Visit: Payer: BC Managed Care – PPO | Admitting: Urology

## 2022-02-06 ENCOUNTER — Encounter: Payer: Self-pay | Admitting: Urology

## 2022-02-06 VITALS — BP 145/77 | HR 80 | Ht 73.5 in | Wt 247.0 lb

## 2022-02-06 DIAGNOSIS — N281 Cyst of kidney, acquired: Secondary | ICD-10-CM

## 2022-02-06 DIAGNOSIS — N401 Enlarged prostate with lower urinary tract symptoms: Secondary | ICD-10-CM

## 2022-02-06 DIAGNOSIS — N529 Male erectile dysfunction, unspecified: Secondary | ICD-10-CM | POA: Diagnosis not present

## 2022-02-06 DIAGNOSIS — N138 Other obstructive and reflux uropathy: Secondary | ICD-10-CM

## 2022-02-06 DIAGNOSIS — D72829 Elevated white blood cell count, unspecified: Secondary | ICD-10-CM

## 2022-02-06 MED ORDER — TADALAFIL 10 MG PO TABS: 10.0000 mg | ORAL_TABLET | Freq: Every day | ORAL | 3 refills | Status: DC | PRN

## 2022-02-06 NOTE — Progress Notes (Signed)
Kurt [name]  [address] [place],  [place]  [address] [phone]  Clinic Day:  02/07/2022  Referring physician: Physicians, [place]:  The patient is a 66 y.o. male  who I was asked to consult upon for due to recent labs consistently showing a mildly low hemoglobin and a relative lymphocytosis.  According to the patient, he has seen these hematologic changes for the past year.  With respect to his mild anemia, microscopic hematuria has been seen in the past.  However, he denies ever having gross hematuria.  He also denies having other overt forms of blood loss.  He recalls having a colonoscopy within the past few years which revealed a benign polyp.  With respect to his relative lymphocytosis, he denies having any recent illnesses/infections.  Other than occasionally mild fatigue, the patient denies having any particular changes in his health.  Of note, his father succumbed to leukemia, which has him somewhat concerned when observing his blood counts.  PAST MEDICAL HISTORY:   Past Medical History:  Diagnosis Date   Anemia    Arthritis    NECK   Cancer ([facility])    SKIN CA   Complication of anesthesia    Difficult intubation    Headache    MIGRAINES   Sleep apnea    USES CPAP    PAST SURGICAL HISTORY:   Past Surgical History:  Procedure Laterality Date   BACK SURGERY     NECK   CHOLECYSTECTOMY     COLONOSCOPY     HERNIA REPAIR     X2   HOLEP-LASER ENUCLEATION OF THE PROSTATE WITH MORCELLATION N/A 12/18/2020   Procedure: HOLEP-LASER ENUCLEATION OF THE PROSTATE WITH MORCELLATION;  Surgeon: Billey Co, MD;  Location: [facility] ORS;  Service: Urology;  Laterality: N/A;   MANDIBLE FRACTURE SURGERY     SHOULDER ARTHROSCOPY Bilateral     CURRENT MEDICATIONS:   Current Outpatient Medications  Medication Sig Dispense Refill   acetaminophen (TYLENOL) 500 MG tablet Take 500 mg by mouth every 6 (six) hours as  needed for moderate pain or headache.     ferrous sulfate 325 (65 FE) MG tablet Take 325 mg by mouth 2 (two) times a week.     fluticasone (FLONASE) 50 MCG/ACT nasal spray Place 1 spray into both nostrils daily as needed for allergies or rhinitis.     ibuprofen (ADVIL) 200 MG tablet Take 200 mg by mouth every 6 (six) hours as needed for headache or moderate pain.     PSEUDOEPHEDRINE HCL PO Take 1 tablet by mouth daily as needed.     tadalafil (CIALIS) 10 MG tablet Take 1 tablet (10 mg total) by mouth daily as needed for erectile dysfunction. 90 tablet 3   tiZANidine (ZANAFLEX) 4 MG tablet Take 1 tablet (4 mg total) by mouth every 6 (six) hours as needed for muscle spasms. 30 tablet 1   VALERIAN ROOT PO Take 2 tablets by mouth at bedtime as needed (sleep).     No current facility-administered medications for this visit.    ALLERGIES:   Allergies  Allergen Reactions   Acetaminophen Other (See Comments)    Red and flushed-OK TO TAKE IN SMALL AMOUNTS PER PT    FAMILY HISTORY:  His father died from leukemia.  His mother died from uterine cancer.  He has 1 son with hemophilia A.  He has 2 brothers and 1 sister who are all relatively healthy.  SOCIAL HISTORY:  The patient was born and raised in [place].  [place] with his wife of 24 years.  They have 2 children.  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  He denies a history of alcoholism or tobacco abuse.  REVIEW OF SYSTEMS:  Review of Systems  Constitutional:  Positive for fatigue. Negative for fever and unexpected weight change.  Respiratory:  Negative for chest tightness, cough, hemoptysis and shortness of breath.   Cardiovascular:  Negative for chest pain and palpitations.  Gastrointestinal:  Negative for abdominal distention, abdominal pain, blood in stool, constipation, diarrhea, nausea and vomiting.  Genitourinary:  Negative for dysuria, frequency and hematuria.   Musculoskeletal:  Positive for  neck stiffness. Negative for arthralgias, back pain and myalgias.  Skin:  Negative for itching and rash.  Neurological:  Negative for dizziness, headaches and light-headedness.  Psychiatric/Behavioral:  Negative for depression and suicidal ideas. The patient is not nervous/anxious.     PHYSICAL EXAM:  Blood pressure 140/87, pulse 77, temperature 98.3 F (36.8 C), resp. rate 16, height 6\' 2"  (1.88 m), weight 252 lb 4.8 oz (114.4 kg), SpO2 96 %. Wt Readings from Last 3 Encounters:  02/07/22 252 lb 4.8 oz (114.4 kg)  02/06/22 247 lb (112 kg)  09/05/21 247 lb (112 kg)   Body mass index is 32.39 kg/m. Performance status (ECOG): 0 - Asymptomatic Physical Exam Constitutional:      Appearance: Normal appearance. He is not ill-appearing.  HENT:     Mouth/Throat:     Mouth: Mucous membranes are moist.     Pharynx: Oropharynx is clear. No oropharyngeal exudate or posterior oropharyngeal erythema.  Cardiovascular:     Rate and Rhythm: Normal rate and regular rhythm.     Heart sounds: No murmur heard.   No friction rub. No gallop.  Pulmonary:     Effort: Pulmonary effort is normal. No respiratory distress.     Breath sounds: Normal breath sounds. No wheezing, rhonchi or rales.  Abdominal:     General: Bowel sounds are normal. There is no distension.     Palpations: Abdomen is soft. There is no mass.     Tenderness: There is no abdominal tenderness.  Musculoskeletal:        General: No swelling.     Right lower leg: No edema.     Left lower leg: No edema.  Lymphadenopathy:     Cervical: No cervical adenopathy.     Upper Body:     Right upper body: No supraclavicular or axillary adenopathy.     Left upper body: No supraclavicular or axillary adenopathy.     Lower Body: No right inguinal adenopathy. No left inguinal adenopathy.  Skin:    General: Skin is warm.     Coloration: Skin is not jaundiced.     Findings: No lesion or rash.  Neurological:     General: No focal deficit present.      Mental Status: He is alert and oriented to person, place, and time. Mental status is at baseline.  Psychiatric:        Mood and Affect: Mood normal.        Behavior: Behavior normal.        Thought Content: Thought content normal.    LABS:    ASSESSMENT & PLAN:  A 66 y.o. male who I was asked to consult upon for both mild anemia and relative lymphocytosis.  With respect to his anemia, I will  check his B12, folate, and vitamin B12 levels to ensure there are no nutritional deficiencies factoring into his anemia.  A serum protein electrophoresis will also be checked to ensure there is no plasma cell dyscrasia factoring into his anemia.  Overall, this gentleman's anemia is very mild.  No immediate intervention appears necessary.  With respect to his relative lymphocytosis, this finding was once again seen per his CBC today.  As there has been no history of any illnesses, my concern is he may have the very early stages of a B-cell lymphoproliferative process.  To prove this, flow cytometry will be done of his peripheral blood to see if any type of aberrant, clonal white cell population is present.  I will see this patient back in 1 month to go over all of his labs collected today, as well as their implications.  The patient understands all the plans discussed today and is in agreement with them.  I do appreciate [facility]icians for his new consult.   Subrina Vecchiarelli Macarthur Critchley, MD

## 2022-02-06 NOTE — Progress Notes (Signed)
° °  02/06/2022 11:01 AM   Kurt [name] May 30, 1956 [id number]  Reason for visit: Follow up BPH status post HOLEP, Bosniak 47F renal cysts, ED  HPI: 66 year old male with a history of elevated PSA and negative prostate biopsy underwent an uncomplicated HOLEP in December 2021 with pathology showing 40 g of benign tissue.  He is doing very well and not having any incontinence, and is urinating with a strong stream.  He has no longer taking Flomax or finasteride.  PSA dropped appropriately to 0.6 in September 2022.  He uses the Cialis occasionally with good results for ED.  He also had microscopic hematuria and a renal ultrasound showed complex renal cyst which prompted an MRI in February 2022 that showed bilateral cysts, with the largest on the right side in the midpole being considered a Bosniak 47F cyst and one year repeat MRI was recommended.  I personally viewed and interpreted the MRI from yesterday that on my review shows no significant changes in the renal cyst bilaterally, including no change in size or enhancement.  Will call with final MRI report once read by radiology.  -Cialis refilled -We will call with final read on MRI-> we discussed may require yearly follow-up for 3 to 5 years    Billey Co, MD  [facility] [address] [place], [place] [address] [phone]

## 2022-02-07 ENCOUNTER — Other Ambulatory Visit: Payer: Self-pay

## 2022-02-07 ENCOUNTER — Inpatient Hospital Stay: Payer: BC Managed Care – PPO | Admitting: Oncology

## 2022-02-07 ENCOUNTER — Telehealth: Payer: Self-pay

## 2022-02-07 ENCOUNTER — Other Ambulatory Visit: Payer: Self-pay | Admitting: Hematology and Oncology

## 2022-02-07 ENCOUNTER — Other Ambulatory Visit: Payer: Self-pay | Admitting: Oncology

## 2022-02-07 ENCOUNTER — Telehealth: Payer: Self-pay | Admitting: Oncology

## 2022-02-07 ENCOUNTER — Inpatient Hospital Stay: Payer: BC Managed Care – PPO | Attending: Oncology

## 2022-02-07 DIAGNOSIS — D649 Anemia, unspecified: Secondary | ICD-10-CM

## 2022-02-07 DIAGNOSIS — N2889 Other specified disorders of kidney and ureter: Secondary | ICD-10-CM

## 2022-02-07 DIAGNOSIS — R5383 Other fatigue: Secondary | ICD-10-CM | POA: Insufficient documentation

## 2022-02-07 DIAGNOSIS — D539 Nutritional anemia, unspecified: Secondary | ICD-10-CM

## 2022-02-07 DIAGNOSIS — D72820 Lymphocytosis (symptomatic): Secondary | ICD-10-CM | POA: Insufficient documentation

## 2022-02-07 DIAGNOSIS — D72829 Elevated white blood cell count, unspecified: Secondary | ICD-10-CM

## 2022-02-07 LAB — CBC AND DIFFERENTIAL
HCT: 37 — AB (ref 41–53)
Hemoglobin: 13 — AB (ref 13.5–17.5)
Neutrophils Absolute: 2.34
Platelets: 249 (ref 150–399)
WBC: 6.5

## 2022-02-07 LAB — IRON AND TIBC
Iron: 132 ug/dL (ref 45–182)
Saturation Ratios: 42 % — ABNORMAL HIGH (ref 17.9–39.5)
TIBC: 317 ug/dL (ref 250–450)
UIBC: 185 ug/dL

## 2022-02-07 LAB — VITAMIN B12: Vitamin B-12: 486 pg/mL (ref 180–914)

## 2022-02-07 LAB — FOLATE: Folate: 22.1 ng/mL (ref >5.9)

## 2022-02-07 LAB — FERRITIN: Ferritin: 135 ng/mL (ref 24–336)

## 2022-02-07 LAB — CBC: RBC: 4.32 (ref 3.87–5.11)

## 2022-02-07 NOTE — Telephone Encounter (Signed)
-----   Message from Billey Co, MD sent at 02/07/2022  9:03 AM EST ----- Good news, radiology agrees that cysts on kidney are stable and benign.  They are recommending a repeat MRI abdomen with and without contrast in 1 year like we discussed, please order MRI for 1 year and schedule follow-up in person to review those results in 1 year.  Nickolas Madrid, MD 02/07/2022

## 2022-02-07 NOTE — Telephone Encounter (Signed)
Pt informed via mychart. Appt scheduled. MRI ordered.

## 2022-02-08 DIAGNOSIS — D649 Anemia, unspecified: Secondary | ICD-10-CM | POA: Diagnosis not present

## 2022-02-08 LAB — SURGICAL PATHOLOGY

## 2022-02-10 DIAGNOSIS — D72820 Lymphocytosis (symptomatic): Secondary | ICD-10-CM | POA: Insufficient documentation

## 2022-02-10 DIAGNOSIS — D649 Anemia, unspecified: Secondary | ICD-10-CM | POA: Insufficient documentation

## 2022-02-11 LAB — PROTEIN ELECTROPHORESIS, SERUM
A/G Ratio: 1.3 (ref 0.7–1.7)
Albumin ELP: 3.9 g/dL (ref 2.9–4.4)
Alpha-1-Globulin: 0.2 g/dL (ref 0.0–0.4)
Alpha-2-Globulin: 0.5 g/dL (ref 0.4–1.0)
Beta Globulin: 1 g/dL (ref 0.7–1.3)
Gamma Globulin: 1.3 g/dL (ref 0.4–1.8)
Globulin, Total: 3.1 g/dL (ref 2.2–3.9)
Total Protein ELP: 7 g/dL (ref 6.0–8.5)

## 2022-02-13 LAB — FLOW CYTOMETRY

## 2022-03-01 NOTE — Progress Notes (Signed)
?[facility]  ?[address] ?[place],  Kurt [name]  [name] ?([address]) [name] ? ?Clinic Day:  03/07/2022 ? ?Referring physician: Street, Sharon Mt, * ? ?This document serves as a record of services personally performed by Kurt Potter, MD. It was created on their behalf by Curry,Lauren E, a trained medical scribe. The creation of this record is based on the scribe's personal observations and the provider's statements to them. ? ?HISTORY OF PRESENT ILLNESS:  ?The patient is a 66 y.o. male  who I recently began seeing for due to recent labs consistently showing a mildly low hemoglobin and a relative lymphocytosis.  He comes in today to go over his flow cytometry results to determine if there is relative lymphocytosis is due to an underlying hematologic malignancy.  Since his last visit, the patient has been doing well.  With respect to his anemia, he denies having increased fatigue or any overt forms of blood loss.  He also denies having any B symptoms or lymphadenopathy which concerns are from a underlying hematologic malignancy being present. ? ?PHYSICAL EXAM:  ?Blood pressure (!) 141/84, pulse 69, temperature 98.2 ?F (36.8 ?C), resp. rate 18, height 6\' 2"  (1.88 m), weight 247 lb 11.2 oz (112.4 kg), SpO2 96 %. ?Wt Readings from Last 3 Encounters:  ?03/07/22 247 lb 11.2 oz (112.4 kg)  ?02/07/22 252 lb 4.8 oz (114.4 kg)  ?02/06/22 247 lb (112 kg)  ? ?Body mass index is 31.8 kg/m?Marland Kitchen ?Performance status (ECOG): 0 - Asymptomatic ?Physical Exam ?Constitutional:   ?   Appearance: Normal appearance. He is not ill-appearing.  ?HENT:  ?   Mouth/Throat:  ?   Mouth: Mucous membranes are moist.  ?   Pharynx: Oropharynx is clear. No oropharyngeal exudate or posterior oropharyngeal erythema.  ?Cardiovascular:  ?   Rate and Rhythm: Normal rate and regular rhythm.  ?   Heart sounds: No murmur heard. ?  No friction rub. No gallop.  ?Pulmonary:  ?   Effort: Pulmonary effort is normal. No respiratory  distress.  ?   Breath sounds: Normal breath sounds. No wheezing, rhonchi or rales.  ?Abdominal:  ?   General: Bowel sounds are normal. There is no distension.  ?   Palpations: Abdomen is soft. There is no mass.  ?   Tenderness: There is no abdominal tenderness.  ?Musculoskeletal:     ?   General: No swelling.  ?   Right lower leg: No edema.  ?   Left lower leg: No edema.  ?Lymphadenopathy:  ?   Cervical: No cervical adenopathy.  ?   Upper Body:  ?   Right upper body: No supraclavicular or axillary adenopathy.  ?   Left upper body: No supraclavicular or axillary adenopathy.  ?   Lower Body: No right inguinal adenopathy. No left inguinal adenopathy.  ?Skin: ?   General: Skin is warm.  ?   Coloration: Skin is not jaundiced.  ?   Findings: No lesion or rash.  ?Neurological:  ?   General: No focal deficit present.  ?   Mental Status: He is alert and oriented to person, place, and time. Mental status is at baseline.  ?Psychiatric:     ?   Mood and Affect: Mood normal.     ?   Behavior: Behavior normal.     ?   Thought Content: Thought content normal.  ? ? ?LABS:  ? ?Flow cytometry of his peripheral blood revealed the following: ?Flow Pathology Report  ?  Clinical history: Lymphocytosis  ? ?DIAGNOSIS:  ?-No monoclonal B-cell population or significant T-cell abnormalities  ?identified  ? ? ? ? ? Latest Reference Range & Units Most Recent  ?Iron 45 - 182 ug/dL 132 ?02/07/22 16:24  ?UIBC ug/dL 185 ?02/07/22 16:24  ?TIBC 250 - 450 ug/dL 317 ?02/07/22 16:24  ?Saturation Ratios 17.9 - 39.5 % 42 (H) ?02/07/22 16:24  ?Ferritin 24 - 336 ng/mL 135 ?02/07/22 16:24  ?Folate >5.9 ng/mL 22.1 ?02/07/22 16:24  ?Vitamin B12 180 - 914 pg/mL 486 ?02/07/22 16:24  ?(H): Data is abnormally high ? Latest Reference Range & Units Most Recent  ?Total Protein ELP 6.0 - 8.5 g/dL 7.0 ?02/07/22 16:24  ?Albumin ELP 2.9 - 4.4 g/dL 3.9 ?02/07/22 16:24  ?Globulin, Total 2.2 - 3.9 g/dL 3.1 (C) ?02/07/22 16:24  ?A/G Ratio 0.7 - 1.7  1.3 (C) ?02/07/22 16:24  ?Alpha-1-Globulin 0.0 -  0.4 g/dL 0.2 ?02/07/22 16:24  ?Alpha-2-Globulin 0.4 - 1.0 g/dL 0.5 ?02/07/22 16:24  ?Beta Globulin 0.7 - 1.3 g/dL 1.0 ?02/07/22 16:24  ?Gamma Globulin 0.4 - 1.8 g/dL 1.3 ?02/07/22 16:24  ?M-SPIKE, % Not Observed g/dL Not Observed ?02/07/22 16:24  ?(C): Corrected ? ? ?ASSESSMENT & PLAN:  ?A 66 y.o. male who I recently began seeing for both mild anemia and relative lymphocytosis.  Understandably, the patient was pleased to see that flow cytometry of his peripheral blood did not reveal any type of hematologic malignancy.  Furthermore, his CBC today actually shows a greater predominance of neutrophils, relative to lymphocytes.  This is further reassuring that there is no underlying hematologic disorder present.  With respect to his anemia, it remains very mild.  Recent labs showed no evidence of any nutritional deficiencies being present.  Furthermore, a serum protein electrophoresis did not reveal an underlying plasma cell dyscrasia factoring into his anemia.  As his hemoglobin remains over 13 and he is clinically doing well, his anemia will be be followed conservatively.  I will see this patient back in 6 months for repeat clinical assessment.  The patient understands all the plans discussed today and is in agreement with them. ? ? ?I, Rita Ohara, am acting as scribe for Kurt Potter, MD   ? ?I have reviewed this report as typed by the medical scribe, and it is complete and accurate. ? ?Kurt Tessler Macarthur Critchley, MD ? ? ? ?  ?

## 2022-03-07 ENCOUNTER — Other Ambulatory Visit: Payer: Self-pay

## 2022-03-07 ENCOUNTER — Telehealth: Payer: Self-pay | Admitting: Oncology

## 2022-03-07 ENCOUNTER — Inpatient Hospital Stay: Payer: BC Managed Care – PPO | Attending: Oncology | Admitting: Oncology

## 2022-03-07 ENCOUNTER — Encounter: Payer: Self-pay | Admitting: Oncology

## 2022-03-07 ENCOUNTER — Inpatient Hospital Stay: Payer: BC Managed Care – PPO

## 2022-03-07 ENCOUNTER — Other Ambulatory Visit: Payer: Self-pay | Admitting: Oncology

## 2022-03-07 VITALS — BP 141/84 | HR 69 | Temp 98.2°F | Resp 18 | Ht 74.0 in | Wt 247.7 lb

## 2022-03-07 DIAGNOSIS — D649 Anemia, unspecified: Secondary | ICD-10-CM | POA: Diagnosis not present

## 2022-03-07 DIAGNOSIS — D72820 Lymphocytosis (symptomatic): Secondary | ICD-10-CM

## 2022-03-07 LAB — CBC AND DIFFERENTIAL
HCT: 41 (ref 41–53)
Hemoglobin: 13.2 — AB (ref 13.5–17.5)
Neutrophils Absolute: 3.34
Platelets: 253 10*3/uL (ref 150–400)
WBC: 7.6

## 2022-03-07 LAB — CBC: RBC: 4.61 (ref 3.87–5.11)

## 2022-03-07 NOTE — Telephone Encounter (Signed)
Per 03/07/22 los next appt scheduled and confirmed with patient ?

## 2022-09-06 ENCOUNTER — Inpatient Hospital Stay: Payer: BC Managed Care – PPO

## 2022-09-06 ENCOUNTER — Other Ambulatory Visit: Payer: Self-pay | Admitting: Oncology

## 2022-09-06 ENCOUNTER — Telehealth: Payer: Self-pay | Admitting: Oncology

## 2022-09-06 ENCOUNTER — Inpatient Hospital Stay: Payer: BC Managed Care – PPO | Attending: Oncology | Admitting: Oncology

## 2022-09-06 VITALS — BP 143/87 | HR 68 | Temp 98.5°F | Resp 18 | Ht 74.0 in | Wt 250.3 lb

## 2022-09-06 DIAGNOSIS — D72820 Lymphocytosis (symptomatic): Secondary | ICD-10-CM

## 2022-09-06 DIAGNOSIS — D649 Anemia, unspecified: Secondary | ICD-10-CM | POA: Diagnosis not present

## 2022-09-06 LAB — CBC AND DIFFERENTIAL
HCT: 39 — AB (ref 41–53)
Hemoglobin: 13.3 — AB (ref 13.5–17.5)
Neutrophils Absolute: 2.48
Platelets: 280 10*3/uL (ref 150–400)
WBC: 7.3

## 2022-09-06 LAB — CBC: RBC: 4.51 (ref 3.87–5.11)

## 2022-09-06 NOTE — Telephone Encounter (Signed)
09/06/22 Next appt scheduled and confirmed with patient

## 2022-09-06 NOTE — Progress Notes (Signed)
[facility]  [address] [place],  [place]  [address] [phone]  Clinic Day:  09/06/2022  Referring physician: Emmaline Kluver, *  HISTORY OF PRESENT ILLNESS:  The patient is a 66 y.o. male who I follow for both mild anemia and a relative lymphocytosis.  Extensive lab work in the past did not elucidate any obvious etiology behind his mild anemia.  Furthermore, flow cytometry of his peripheral blood did not show that his relative lymphocytosis was due to an underlying hematologic malignancy.  He comes in today for routine follow-up.  Since his last visit, the patient has been doing well.  With respect to his mild anemia, he denies having increased fatigue or any overt forms of blood loss.  He also denies having any B symptoms or lymphadenopathy which concerns him for an underlying hematologic malignancy being present.  PHYSICAL EXAM:  Blood pressure (!) 143/87, pulse 68, temperature 98.5 F (36.9 C), resp. rate 18, height '6\' 2"'$  (1.88 m), weight 250 lb 4.8 oz (113.5 kg), SpO2 95 %. Wt Readings from Last 3 Encounters:  09/06/22 250 lb 4.8 oz (113.5 kg)  03/07/22 247 lb 11.2 oz (112.4 kg)  02/07/22 252 lb 4.8 oz (114.4 kg)   Body mass index is 32.14 kg/m. Performance status (ECOG): 0 - Asymptomatic Physical Exam Constitutional:      Appearance: Normal appearance. He is not ill-appearing.  HENT:     Mouth/Throat:     Mouth: Mucous membranes are moist.     Pharynx: Oropharynx is clear. No oropharyngeal exudate or posterior oropharyngeal erythema.  Cardiovascular:     Rate and Rhythm: Normal rate and regular rhythm.     Heart sounds: No murmur heard.    No friction rub. No gallop.  Pulmonary:     Effort: Pulmonary effort is normal. No respiratory distress.     Breath sounds: Normal breath sounds. No wheezing, rhonchi or rales.  Abdominal:     General: Bowel sounds are normal. There is no distension.     Palpations: Abdomen is soft. There  is no mass.     Tenderness: There is no abdominal tenderness.  Musculoskeletal:        General: No swelling.     Right lower leg: No edema.     Left lower leg: No edema.  Lymphadenopathy:     Cervical: No cervical adenopathy.     Upper Body:     Right upper body: No supraclavicular or axillary adenopathy.     Left upper body: No supraclavicular or axillary adenopathy.     Lower Body: No right inguinal adenopathy. No left inguinal adenopathy.  Skin:    General: Skin is warm.     Coloration: Skin is not jaundiced.     Findings: No lesion or rash.  Neurological:     General: No focal deficit present.     Mental Status: He is alert and oriented to person, place, and time. Mental status is at baseline.  Psychiatric:        Mood and Affect: Mood normal.        Behavior: Behavior normal.        Thought Content: Thought content normal.    LABS:    ASSESSMENT & PLAN:  A 66 y.o. male who I recently began seeing for both mild anemia and relative lymphocytosis. Once again, his labs today show stability with his peripheral counts.  His hemoglobin remains only minimally low.  He still has a relative  lymphocytosis.  However, these findings have been stable for the past year.  There also remains nothing per his history or physical exam to suggest a more ominous hematologic process is present.  As he is clinically doing well, I do feel comfortable seeing him back in 1 year for repeat clinical assessment. The patient understands all the plans discussed today and is in agreement with them.  Maejor Erven Macarthur Critchley, MD

## 2022-11-19 ENCOUNTER — Encounter: Payer: Self-pay | Admitting: Urology

## 2023-02-05 ENCOUNTER — Ambulatory Visit: Payer: BC Managed Care – PPO | Admitting: Urology

## 2023-02-06 ENCOUNTER — Ambulatory Visit: Payer: BC Managed Care – PPO | Admitting: Urology

## 2023-02-07 ENCOUNTER — Ambulatory Visit
Admission: RE | Admit: 2023-02-07 | Discharge: 2023-02-07 | Disposition: A | Discharge status: Home / Self Care | Payer: Medicare PPO | Source: Ambulatory Visit | Attending: Urology

## 2023-02-07 DIAGNOSIS — N2889 Other specified disorders of kidney and ureter: Secondary | ICD-10-CM | POA: Diagnosis not present

## 2023-02-07 MED ORDER — GADOBUTROL 1 MMOL/ML IV SOLN
10.0000 mL | Freq: Once | INTRAVENOUS | Status: AC | PRN
  Administered 2023-02-07: 10 mL via INTRAVENOUS

## 2023-02-12 ENCOUNTER — Ambulatory Visit: Payer: Medicare PPO | Admitting: Urology

## 2023-02-12 VITALS — BP 128/84 | HR 76 | Ht 74.0 in | Wt 245.0 lb

## 2023-02-12 DIAGNOSIS — N281 Cyst of kidney, acquired: Secondary | ICD-10-CM

## 2023-02-12 DIAGNOSIS — N2889 Other specified disorders of kidney and ureter: Secondary | ICD-10-CM

## 2023-02-12 DIAGNOSIS — N529 Male erectile dysfunction, unspecified: Secondary | ICD-10-CM | POA: Diagnosis not present

## 2023-02-12 DIAGNOSIS — N138 Other obstructive and reflux uropathy: Secondary | ICD-10-CM | POA: Diagnosis not present

## 2023-02-12 DIAGNOSIS — N401 Enlarged prostate with lower urinary tract symptoms: Secondary | ICD-10-CM | POA: Diagnosis not present

## 2023-02-12 LAB — BLADDER SCAN AMB NON-IMAGING: SCA Result: 0

## 2023-02-12 MED ORDER — TADALAFIL 10 MG PO TABS: 10.0000 mg | ORAL_TABLET | Freq: Every day | ORAL | 3 refills | Status: DC | PRN

## 2023-02-12 NOTE — Progress Notes (Signed)
   02/12/2023 2:25 PM   Kurt [name] 1956/07/21 [id number]  Reason for visit: Follow up BPH status post HOLEP, Bosniak 71F renal cysts, ED  HPI: 67 year old male with a history of elevated PSA and negative prostate biopsy underwent an uncomplicated HOLEP in December 2021 with pathology showing 40 g of benign tissue.  He is doing very well and not having any incontinence, and is urinating with a strong stream.  He has no longer taking Flomax or finasteride.  PSA dropped appropriately to 0.6 in September 2022.  He denies any urinary complaints today, PVR is normal at 59m.  He is using the Cialis as needed with good results for ED.  He was using the 10 mg dose with good results.  We reviewed that this could be taken up to 20 mg at a time for the max dose.  Risk and benefits were again reviewed.  He also has a history of asymptomatic microscopic hematuria and a renal ultrasound showed a complex renal cyst which prompted an MRI in February 2022.  This showed bilateral cysts,with the largest on the right side in the midpole being considered a Bosniak 71F cyst and one year repeat MRI was recommended.  I personally viewed and interpreted the most recent MRI dated 02/07/2023 that shows no changes in the complex right renal cyst, most likely benign.  Follow-up MRI in 1 year recommended.  He also had a number of questions about his liver on the MRI, and we reviewed that there were no abnormal liver findings.  -Cialis refilled, 10 to 20 mg on demand for ED -RTC 1 year PVR and repeat MRI abdomen for surveillance of Bosniak 71F renal cyst   BBilley Co MD  [name] SFancy FarmBPukw[place] [phone]

## 2023-04-28 DIAGNOSIS — S20212A Contusion of left front wall of thorax, initial encounter: Secondary | ICD-10-CM | POA: Diagnosis not present

## 2023-04-28 DIAGNOSIS — S20229A Contusion of unspecified back wall of thorax, initial encounter: Secondary | ICD-10-CM | POA: Diagnosis not present

## 2023-04-28 DIAGNOSIS — S40012A Contusion of left shoulder, initial encounter: Secondary | ICD-10-CM | POA: Diagnosis not present

## 2023-05-02 DIAGNOSIS — G4733 Obstructive sleep apnea (adult) (pediatric): Secondary | ICD-10-CM | POA: Diagnosis not present

## 2023-05-02 DIAGNOSIS — G4750 Parasomnia, unspecified: Secondary | ICD-10-CM | POA: Diagnosis not present

## 2023-05-02 DIAGNOSIS — Z6833 Body mass index (BMI) 33.0-33.9, adult: Secondary | ICD-10-CM | POA: Diagnosis not present

## 2023-05-21 DIAGNOSIS — G4733 Obstructive sleep apnea (adult) (pediatric): Secondary | ICD-10-CM | POA: Diagnosis not present

## 2023-05-21 DIAGNOSIS — R0902 Hypoxemia: Secondary | ICD-10-CM | POA: Diagnosis not present

## 2023-05-21 DIAGNOSIS — G4736 Sleep related hypoventilation in conditions classified elsewhere: Secondary | ICD-10-CM | POA: Diagnosis not present

## 2023-05-23 DIAGNOSIS — G4733 Obstructive sleep apnea (adult) (pediatric): Secondary | ICD-10-CM | POA: Diagnosis not present

## 2023-05-23 DIAGNOSIS — Z6833 Body mass index (BMI) 33.0-33.9, adult: Secondary | ICD-10-CM | POA: Diagnosis not present

## 2023-05-23 DIAGNOSIS — R030 Elevated blood-pressure reading, without diagnosis of hypertension: Secondary | ICD-10-CM | POA: Diagnosis not present

## 2023-05-23 DIAGNOSIS — G4750 Parasomnia, unspecified: Secondary | ICD-10-CM | POA: Diagnosis not present

## 2023-06-05 DIAGNOSIS — G4733 Obstructive sleep apnea (adult) (pediatric): Secondary | ICD-10-CM | POA: Diagnosis not present

## 2023-06-13 DIAGNOSIS — H6122 Impacted cerumen, left ear: Secondary | ICD-10-CM | POA: Diagnosis not present

## 2023-07-05 DIAGNOSIS — G4733 Obstructive sleep apnea (adult) (pediatric): Secondary | ICD-10-CM | POA: Diagnosis not present

## 2023-07-18 DIAGNOSIS — G4733 Obstructive sleep apnea (adult) (pediatric): Secondary | ICD-10-CM | POA: Diagnosis not present

## 2023-08-05 DIAGNOSIS — G4733 Obstructive sleep apnea (adult) (pediatric): Secondary | ICD-10-CM | POA: Diagnosis not present

## 2023-08-19 DIAGNOSIS — G4733 Obstructive sleep apnea (adult) (pediatric): Secondary | ICD-10-CM | POA: Diagnosis not present

## 2023-08-19 DIAGNOSIS — Z6831 Body mass index (BMI) 31.0-31.9, adult: Secondary | ICD-10-CM | POA: Diagnosis not present

## 2023-08-19 DIAGNOSIS — G4750 Parasomnia, unspecified: Secondary | ICD-10-CM | POA: Diagnosis not present

## 2023-09-04 DIAGNOSIS — G4733 Obstructive sleep apnea (adult) (pediatric): Secondary | ICD-10-CM | POA: Diagnosis not present

## 2023-09-04 NOTE — Progress Notes (Signed)
[facility]  [address] [place],  [place]  [address] [phone]  Clinic Day:  09/05/2023  Referring physician: Bobbye Morton, *  HISTORY OF PRESENT ILLNESS:  The patient is a 67 y.o. male who I follow for both mild anemia and a relative lymphocytosis.  Extensive lab work in the past did not elucidate any obvious etiology behind his mild anemia.  Furthermore, flow cytometry of his peripheral blood did not show that his relative lymphocytosis was due to an underlying hematologic malignancy.  He comes in today for routine follow-up.  Since his last visit, the patient has been doing well.  With respect to his mild anemia, he denies having increased fatigue or any overt forms of blood loss.  He also denies having any B symptoms or lymphadenopathy which concerns him for an underlying hematologic malignancy being present.  PHYSICAL EXAM:  Blood pressure (!) 160/94, pulse 69, temperature 98.5 F (36.9 C), resp. rate 16, height 6\' 2"  (1.88 m), weight 239 lb 11.2 oz (108.7 kg), SpO2 96%. Wt Readings from Last 3 Encounters:  09/05/23 239 lb 11.2 oz (108.7 kg)  02/12/23 245 lb (111.1 kg)  09/06/22 250 lb 4.8 oz (113.5 kg)   Body mass index is 30.78 kg/m. Performance status (ECOG): 0 - Asymptomatic Physical Exam Constitutional:      Appearance: Normal appearance. He is not ill-appearing.  HENT:     Mouth/Throat:     Mouth: Mucous membranes are moist.     Pharynx: Oropharynx is clear. No oropharyngeal exudate or posterior oropharyngeal erythema.  Cardiovascular:     Rate and Rhythm: Normal rate and regular rhythm.     Heart sounds: No murmur heard.    No friction rub. No gallop.  Pulmonary:     Effort: Pulmonary effort is normal. No respiratory distress.     Breath sounds: Normal breath sounds. No wheezing, rhonchi or rales.  Abdominal:     General: Bowel sounds are normal. There is no distension.     Palpations: Abdomen is soft. There is no  mass.     Tenderness: There is no abdominal tenderness.  Musculoskeletal:        General: No swelling.     Right lower leg: No edema.     Left lower leg: No edema.  Lymphadenopathy:     Cervical: No cervical adenopathy.     Upper Body:     Right upper body: No supraclavicular or axillary adenopathy.     Left upper body: No supraclavicular or axillary adenopathy.     Lower Body: No right inguinal adenopathy. No left inguinal adenopathy.  Skin:    General: Skin is warm.     Coloration: Skin is not jaundiced.     Findings: No lesion or rash.  Neurological:     General: No focal deficit present.     Mental Status: He is alert and oriented to person, place, and time. Mental status is at baseline.  Psychiatric:        Mood and Affect: Mood normal.        Behavior: Behavior normal.        Thought Content: Thought content normal.    LABS:    ASSESSMENT & PLAN:  A 67 y.o. male with both mild anemia and relative lymphocytosis. Once again, his labs today show stability with his peripheral counts.  His hemoglobin remains only minimally low.  He still has a relative lymphocytosis.  However, these findings have been stable  for years.  There also remains nothing per his history or physical exam to suggest a more ominous hematologic process is present.  As he is clinically doing well, I do feel comfortable continuing to follow him on a yearly basis.  The patient understands all the plans discussed today and is in agreement with them.  Andilynn Delavega Kirby Funk, MD

## 2023-09-05 ENCOUNTER — Inpatient Hospital Stay: Payer: Medicare PPO | Admitting: Oncology

## 2023-09-05 ENCOUNTER — Inpatient Hospital Stay: Payer: Medicare PPO | Attending: Oncology

## 2023-09-05 ENCOUNTER — Other Ambulatory Visit: Payer: Self-pay | Admitting: Oncology

## 2023-09-05 ENCOUNTER — Telehealth: Payer: Self-pay | Admitting: Oncology

## 2023-09-05 VITALS — BP 135/85 | HR 69 | Temp 98.5°F | Resp 16 | Ht 74.0 in | Wt 239.7 lb

## 2023-09-05 DIAGNOSIS — D72820 Lymphocytosis (symptomatic): Secondary | ICD-10-CM | POA: Diagnosis not present

## 2023-09-05 DIAGNOSIS — D649 Anemia, unspecified: Secondary | ICD-10-CM

## 2023-09-05 DIAGNOSIS — G4733 Obstructive sleep apnea (adult) (pediatric): Secondary | ICD-10-CM | POA: Diagnosis not present

## 2023-09-05 LAB — CBC AND DIFFERENTIAL
HCT: 41 (ref 41–53)
Hemoglobin: 13.9 (ref 13.5–17.5)
Neutrophils Absolute: 2.65
Platelets: 268 10*3/uL (ref 150–400)
WBC: 7.8

## 2023-09-05 LAB — CBC: RBC: 4.72 (ref 3.87–5.11)

## 2023-09-05 NOTE — Telephone Encounter (Signed)
Contacted pt to schedule an appt. Unable to reach via phone, voicemail was left.    Scheduling Message Entered by Rennis Harding A on 09/05/2023 at 10:25 AM Priority: Routine <No visit type provided>  Department: [facility]  Provider:  Scheduling Notes:  Labs/appt on 09-03-24

## 2023-09-24 NOTE — Telephone Encounter (Signed)
Unable to reach pt to schedule an appt.. Appt will be scheduled and mailed to the address on file.

## 2023-10-05 DIAGNOSIS — G4733 Obstructive sleep apnea (adult) (pediatric): Secondary | ICD-10-CM | POA: Diagnosis not present

## 2023-10-29 DIAGNOSIS — L820 Inflamed seborrheic keratosis: Secondary | ICD-10-CM | POA: Diagnosis not present

## 2023-11-05 DIAGNOSIS — G4733 Obstructive sleep apnea (adult) (pediatric): Secondary | ICD-10-CM | POA: Diagnosis not present

## 2023-12-03 DIAGNOSIS — G4733 Obstructive sleep apnea (adult) (pediatric): Secondary | ICD-10-CM | POA: Diagnosis not present

## 2023-12-05 DIAGNOSIS — G4733 Obstructive sleep apnea (adult) (pediatric): Secondary | ICD-10-CM | POA: Diagnosis not present

## 2024-01-05 DIAGNOSIS — G4733 Obstructive sleep apnea (adult) (pediatric): Secondary | ICD-10-CM | POA: Diagnosis not present

## 2024-01-22 DIAGNOSIS — R7301 Impaired fasting glucose: Secondary | ICD-10-CM | POA: Diagnosis not present

## 2024-01-22 DIAGNOSIS — N401 Enlarged prostate with lower urinary tract symptoms: Secondary | ICD-10-CM | POA: Diagnosis not present

## 2024-01-22 DIAGNOSIS — G4733 Obstructive sleep apnea (adult) (pediatric): Secondary | ICD-10-CM | POA: Diagnosis not present

## 2024-01-22 DIAGNOSIS — R351 Nocturia: Secondary | ICD-10-CM | POA: Diagnosis not present

## 2024-01-22 DIAGNOSIS — Z79899 Other long term (current) drug therapy: Secondary | ICD-10-CM | POA: Diagnosis not present

## 2024-01-22 DIAGNOSIS — Z125 Encounter for screening for malignant neoplasm of prostate: Secondary | ICD-10-CM | POA: Diagnosis not present

## 2024-01-22 DIAGNOSIS — Z0000 Encounter for general adult medical examination without abnormal findings: Secondary | ICD-10-CM | POA: Diagnosis not present

## 2024-01-22 DIAGNOSIS — E785 Hyperlipidemia, unspecified: Secondary | ICD-10-CM | POA: Diagnosis not present

## 2024-01-22 DIAGNOSIS — Z23 Encounter for immunization: Secondary | ICD-10-CM | POA: Diagnosis not present

## 2024-01-22 LAB — LAB REPORT - SCANNED
A1c: 5.1
EGFR: 60

## 2024-02-02 ENCOUNTER — Ambulatory Visit: Payer: Medicare PPO

## 2024-02-05 DIAGNOSIS — G4733 Obstructive sleep apnea (adult) (pediatric): Secondary | ICD-10-CM | POA: Diagnosis not present

## 2024-02-11 ENCOUNTER — Other Ambulatory Visit: Payer: Self-pay

## 2024-02-11 DIAGNOSIS — N2889 Other specified disorders of kidney and ureter: Secondary | ICD-10-CM

## 2024-02-19 ENCOUNTER — Ambulatory Visit: Payer: Self-pay | Admitting: Urology

## 2024-03-02 DIAGNOSIS — G4733 Obstructive sleep apnea (adult) (pediatric): Secondary | ICD-10-CM | POA: Diagnosis not present

## 2024-03-04 DIAGNOSIS — G4733 Obstructive sleep apnea (adult) (pediatric): Secondary | ICD-10-CM | POA: Diagnosis not present

## 2024-03-11 ENCOUNTER — Encounter (HOSPITAL_BASED_OUTPATIENT_CLINIC_OR_DEPARTMENT_OTHER): Payer: Self-pay

## 2024-03-11 ENCOUNTER — Ambulatory Visit (HOSPITAL_BASED_OUTPATIENT_CLINIC_OR_DEPARTMENT_OTHER)
Admission: RE | Admit: 2024-03-11 | Discharge: 2024-03-11 | Disposition: A | Discharge status: Home / Self Care | Payer: Medicare PPO | Source: Ambulatory Visit | Attending: Urology | Admitting: Urology

## 2024-03-11 DIAGNOSIS — N2889 Other specified disorders of kidney and ureter: Secondary | ICD-10-CM

## 2024-03-16 ENCOUNTER — Ambulatory Visit: Payer: Medicare PPO | Admitting: Urology

## 2024-03-24 DIAGNOSIS — N529 Male erectile dysfunction, unspecified: Secondary | ICD-10-CM | POA: Diagnosis not present

## 2024-03-24 DIAGNOSIS — N400 Enlarged prostate without lower urinary tract symptoms: Secondary | ICD-10-CM | POA: Diagnosis not present

## 2024-03-24 DIAGNOSIS — Z85828 Personal history of other malignant neoplasm of skin: Secondary | ICD-10-CM | POA: Diagnosis not present

## 2024-03-24 DIAGNOSIS — Z809 Family history of malignant neoplasm, unspecified: Secondary | ICD-10-CM | POA: Diagnosis not present

## 2024-04-02 DIAGNOSIS — G4733 Obstructive sleep apnea (adult) (pediatric): Secondary | ICD-10-CM | POA: Diagnosis not present

## 2024-04-04 DIAGNOSIS — G4733 Obstructive sleep apnea (adult) (pediatric): Secondary | ICD-10-CM | POA: Diagnosis not present

## 2024-04-15 ENCOUNTER — Ambulatory Visit: Payer: Medicare PPO | Admitting: Urology

## 2024-05-04 DIAGNOSIS — G4733 Obstructive sleep apnea (adult) (pediatric): Secondary | ICD-10-CM | POA: Diagnosis not present

## 2024-05-05 ENCOUNTER — Other Ambulatory Visit: Payer: Self-pay | Admitting: Urology

## 2024-05-05 ENCOUNTER — Telehealth: Payer: Self-pay

## 2024-05-05 MED ORDER — DIAZEPAM 5 MG PO TABS: 5.0000 mg | ORAL_TABLET | Freq: Once | ORAL | 0 refills | Status: DC | PRN

## 2024-05-05 NOTE — Telephone Encounter (Signed)
 Pt called stating he is scheduled for MRI tomorrow. Pt states last time he had a panic attack and was sent in Valium  to help. Pt asked if he could have valium  sent in for tomorrow. Pharmacy is [facility] on [address]

## 2024-05-06 ENCOUNTER — Ambulatory Visit: Admission: RE | Admit: 2024-05-06 | Source: Ambulatory Visit

## 2024-05-08 ENCOUNTER — Ambulatory Visit
Admission: RE | Admit: 2024-05-08 | Discharge: 2024-05-08 | Disposition: A | Discharge status: Home / Self Care | Source: Ambulatory Visit | Attending: Urology

## 2024-05-08 DIAGNOSIS — Z9049 Acquired absence of other specified parts of digestive tract: Secondary | ICD-10-CM | POA: Diagnosis not present

## 2024-05-08 DIAGNOSIS — N2889 Other specified disorders of kidney and ureter: Secondary | ICD-10-CM | POA: Insufficient documentation

## 2024-05-08 MED ORDER — GADOBUTROL 1 MMOL/ML IV SOLN
10.0000 mL | Freq: Once | INTRAVENOUS | Status: AC | PRN
  Administered 2024-05-08: 10 mL via INTRAVENOUS

## 2024-05-11 ENCOUNTER — Ambulatory Visit: Payer: Self-pay | Admitting: Urology

## 2024-06-04 DIAGNOSIS — G4733 Obstructive sleep apnea (adult) (pediatric): Secondary | ICD-10-CM | POA: Diagnosis not present

## 2024-07-08 ENCOUNTER — Ambulatory Visit: Admitting: Urology

## 2024-07-08 VITALS — BP 142/85 | Ht 74.0 in | Wt 236.8 lb

## 2024-07-08 DIAGNOSIS — N138 Other obstructive and reflux uropathy: Secondary | ICD-10-CM | POA: Diagnosis not present

## 2024-07-08 DIAGNOSIS — N2889 Other specified disorders of kidney and ureter: Secondary | ICD-10-CM

## 2024-07-08 DIAGNOSIS — N529 Male erectile dysfunction, unspecified: Secondary | ICD-10-CM

## 2024-07-08 DIAGNOSIS — N401 Enlarged prostate with lower urinary tract symptoms: Secondary | ICD-10-CM

## 2024-07-08 LAB — BLADDER SCAN AMB NON-IMAGING

## 2024-07-08 MED ORDER — TADALAFIL 10 MG PO TABS: 10.0000 mg | ORAL_TABLET | Freq: Every day | ORAL | 3 refills | Status: AC | PRN

## 2024-07-08 MED ORDER — TADALAFIL 10 MG PO TABS: 10.0000 mg | ORAL_TABLET | Freq: Every day | ORAL | 3 refills | Status: DC | PRN

## 2024-07-08 NOTE — Progress Notes (Signed)
   07/08/2024 2:35 PM   Kurt [name] Nov 01, 1956 [id number]  Reason for visit: Follow up BPH status post HOLEP, Bosniak 80F renal cysts, ED  HPI: 68 year old male with a history of elevated PSA and negative prostate biopsy underwent an uncomplicated HOLEP in December 2021 with pathology showing 40 g of benign tissue.  He is doing very well and not having any incontinence, and is urinating with a strong stream.  He has no longer taking Flomax or finasteride.  PSA dropped appropriately to 0.6 in September 2022, PSA with PCP is reportedly remained normal but those records are not available to me.  He denies any urinary complaints today, PVR is normal at 11ml.  He is using the Cialis  as needed with good results for ED.  He was using the 10 mg dose with good results.  We reviewed that this could be taken up to 20 mg at a time for the max dose.  Risk and benefits were again reviewed.  He also has a history of asymptomatic microscopic hematuria and a renal ultrasound showed a complex renal cyst which prompted an MRI in February 2022.  This showed bilateral cysts,with the largest on the right side in the midpole being considered a Bosniak 80F cyst and one year repeat MRI was recommended.  I personally viewed and interpreted the most recent MRI dated 05/08/2024 showing minimal increase in the size of the complex right renal Bosniak 80F cyst, most likely benign.  Reassurance was provided.  Per the guidelines, consider monitoring for 5 years total and discontinuing routine MRI if stable.  -Cialis  refilled, 10 to 20 mg on demand for ED -RTC 1 year PVR and repeat MRI abdomen for surveillance of Bosniak 80F renal cyst, if stable consider stopping surveillance MRI after 2027   Kurt JAYSON Burnet, MD   [facility] [address] [place], [place] [address] [phone]

## 2024-08-27 LAB — COLOGUARD: COLOGUARD: NEGATIVE

## 2024-09-03 ENCOUNTER — Inpatient Hospital Stay: Attending: Oncology

## 2024-09-03 ENCOUNTER — Ambulatory Visit: Payer: Medicare PPO | Admitting: Oncology

## 2024-09-03 ENCOUNTER — Inpatient Hospital Stay: Admitting: Oncology

## 2024-09-03 ENCOUNTER — Other Ambulatory Visit: Payer: Medicare PPO

## 2024-09-03 ENCOUNTER — Other Ambulatory Visit: Payer: Self-pay | Admitting: Oncology

## 2024-09-03 ENCOUNTER — Telehealth: Payer: Self-pay | Admitting: Oncology

## 2024-09-03 VITALS — BP 142/82 | HR 75 | Temp 97.9°F | Resp 16 | Ht 74.0 in | Wt 231.4 lb

## 2024-09-03 DIAGNOSIS — D649 Anemia, unspecified: Secondary | ICD-10-CM | POA: Insufficient documentation

## 2024-09-03 DIAGNOSIS — D72820 Lymphocytosis (symptomatic): Secondary | ICD-10-CM

## 2024-09-03 LAB — CBC WITH DIFFERENTIAL (CANCER CENTER ONLY)
Abs Immature Granulocytes: 0.02 K/uL (ref 0.00–0.07)
Basophils Absolute: 0 K/uL (ref 0.0–0.1)
Basophils Relative: 0 %
Eosinophils Absolute: 0.1 K/uL (ref 0.0–0.5)
Eosinophils Relative: 1 %
HCT: 38.7 % — ABNORMAL LOW (ref 39.0–52.0)
Hemoglobin: 13.2 g/dL (ref 13.0–17.0)
Immature Granulocytes: 0 %
Lymphocytes Relative: 35 %
Lymphs Abs: 2.5 K/uL (ref 0.7–4.0)
MCH: 29.8 pg (ref 26.0–34.0)
MCHC: 34.1 g/dL (ref 30.0–36.0)
MCV: 87.4 fL (ref 80.0–100.0)
Monocytes Absolute: 0.8 K/uL (ref 0.1–1.0)
Monocytes Relative: 11 %
Neutro Abs: 3.8 K/uL (ref 1.7–7.7)
Neutrophils Relative %: 53 %
Platelet Count: 265 K/uL (ref 150–400)
RBC: 4.43 MIL/uL (ref 4.22–5.81)
RDW: 13.5 % (ref 11.5–15.5)
WBC Count: 7.1 K/uL (ref 4.0–10.5)
nRBC: 0 % (ref 0.0–0.2)

## 2024-09-03 NOTE — Progress Notes (Deleted)
 [facility]  [address] [place],  [place]  [address] [phone]  Clinic Day:  09/05/2023  Referring physician: Rusty Lonni HERO, *  HISTORY OF PRESENT ILLNESS:  The patient is a 68 y.o. male who I follow for both mild anemia and a relative lymphocytosis.  Extensive lab work in the past did not elucidate any obvious etiology behind his mild anemia.  Furthermore, flow cytometry of his peripheral blood did not show that his relative lymphocytosis was due to an underlying hematologic malignancy.  He comes in today for routine follow-up.  Since his last visit, the patient has been doing well.  With respect to his mild anemia, he denies having increased fatigue or any overt forms of blood loss.  He also denies having any B symptoms or lymphadenopathy which concerns him for an underlying hematologic malignancy being present.  PHYSICAL EXAM:  There were no vitals taken for this visit. Wt Readings from Last 3 Encounters:  07/08/24 236 lb 12.8 oz (107.4 kg)  09/05/23 239 lb 11.2 oz (108.7 kg)  02/12/23 245 lb (111.1 kg)   There is no height or weight on file to calculate BMI. Performance status (ECOG): 0 - Asymptomatic Physical Exam Constitutional:      Appearance: Normal appearance. He is not ill-appearing.  HENT:     Mouth/Throat:     Mouth: Mucous membranes are moist.     Pharynx: Oropharynx is clear. No oropharyngeal exudate or posterior oropharyngeal erythema.  Cardiovascular:     Rate and Rhythm: Normal rate and regular rhythm.     Heart sounds: No murmur heard.    No friction rub. No gallop.  Pulmonary:     Effort: Pulmonary effort is normal. No respiratory distress.     Breath sounds: Normal breath sounds. No wheezing, rhonchi or rales.  Abdominal:     General: Bowel sounds are normal. There is no distension.     Palpations: Abdomen is soft. There is no mass.     Tenderness: There is no abdominal tenderness.  Musculoskeletal:         General: No swelling.     Right lower leg: No edema.     Left lower leg: No edema.  Lymphadenopathy:     Cervical: No cervical adenopathy.     Upper Body:     Right upper body: No supraclavicular or axillary adenopathy.     Left upper body: No supraclavicular or axillary adenopathy.     Lower Body: No right inguinal adenopathy. No left inguinal adenopathy.  Skin:    General: Skin is warm.     Coloration: Skin is not jaundiced.     Findings: No lesion or rash.  Neurological:     General: No focal deficit present.     Mental Status: He is alert and oriented to person, place, and time. Mental status is at baseline.  Psychiatric:        Mood and Affect: Mood normal.        Behavior: Behavior normal.        Thought Content: Thought content normal.    LABS:    ASSESSMENT & PLAN:  A 68 y.o. male with both mild anemia and relative lymphocytosis. Once again, his labs today show stability with his peripheral counts.  His hemoglobin remains only minimally low.  He still has a relative lymphocytosis.  However, these findings have been stable for years.  There also remains nothing per his history or physical exam to suggest  a more ominous hematologic process is present.  As he is clinically doing well, I do feel comfortable continuing to follow him on a yearly basis.  The patient understands all the plans discussed today and is in agreement with them.  Brentton Wardlow DELENA Kerns, MD

## 2024-09-03 NOTE — Telephone Encounter (Signed)
 Patient has been scheduled for follow-up visit per 09/03/24 LOS.  Pt given an appt calendar with date and time.

## 2024-09-04 NOTE — Progress Notes (Signed)
 [facility] [address] [place],  [place]  [address] [phone]  Clinic Day:  09/03/2024  Referring physician: Rusty Lonni HERO, *   HISTORY OF PRESENT ILLNESS:  The patient is a 68 y.o. male who I follow for both mild anemia and a relative lymphocytosis.  Extensive lab work in the past did not elucidate any obvious etiology behind his mild anemia.  Furthermore, flow cytometry of his peripheral blood did not show that his relative lymphocytosis was due to an underlying hematologic malignancy.  He comes in today for routine follow-up.  Since his last visit, the patient has been doing well.  With respect to his mild anemia, he denies having increased fatigue or any overt forms of blood loss.  He also denies having any B symptoms or lymphadenopathy which concerns him for an underlying hematologic malignancy being present.  Overall, he denies having any significant changes in his health since his last visit.    PHYSICAL EXAM:  Blood pressure (!) 142/82, pulse 75, temperature 97.9 F (36.6 C), temperature source Oral, resp. rate 16, height 6' 2 (1.88 m), weight 231 lb 6.4 oz (105 kg), SpO2 98%. Wt Readings from Last 3 Encounters:  09/03/24 231 lb 6.4 oz (105 kg)  07/08/24 236 lb 12.8 oz (107.4 kg)  09/05/23 239 lb 11.2 oz (108.7 kg)   Body mass index is 29.71 kg/m. Performance status (ECOG): 0 - Asymptomatic Physical Exam Constitutional:      Appearance: Normal appearance. He is not ill-appearing.  HENT:     Mouth/Throat:     Mouth: Mucous membranes are moist.     Pharynx: Oropharynx is clear. No oropharyngeal exudate or posterior oropharyngeal erythema.  Cardiovascular:     Rate and Rhythm: Normal rate and regular rhythm.     Heart sounds: No murmur heard.    No friction rub. No gallop.  Pulmonary:     Effort: Pulmonary effort is normal. No respiratory distress.     Breath sounds: Normal breath sounds. No wheezing, rhonchi or rales.   Abdominal:     General: Bowel sounds are normal. There is no distension.     Palpations: Abdomen is soft. There is no mass.     Tenderness: There is no abdominal tenderness.  Musculoskeletal:        General: No swelling.     Right lower leg: No edema.     Left lower leg: No edema.  Lymphadenopathy:     Cervical: No cervical adenopathy.     Upper Body:     Right upper body: No supraclavicular or axillary adenopathy.     Left upper body: No supraclavicular or axillary adenopathy.     Lower Body: No right inguinal adenopathy. No left inguinal adenopathy.  Skin:    General: Skin is warm.     Coloration: Skin is not jaundiced.     Findings: No lesion or rash.  Neurological:     General: No focal deficit present.     Mental Status: He is alert and oriented to person, place, and time. Mental status is at baseline.  Psychiatric:        Mood and Affect: Mood normal.        Behavior: Behavior normal.        Thought Content: Thought content normal.    LABS:      Latest Ref Rng & Units 09/03/2024    1:21 PM 09/05/2023   12:00 AM 09/06/2022   12:00 AM  CBC  WBC 4.0 - 10.5 K/uL 7.1  7.8     7.3      Hemoglobin 13.0 - 17.0 g/dL 86.7  86.0     86.6      Hematocrit 39.0 - 52.0 % 38.7  41     39      Platelets 150 - 400 K/uL 265  268     280         This result is from an external source.    Latest Reference Range & Units 09/03/24 13:21  Neutrophils % 53  Lymphocytes % 35  Monocytes Relative % 11  Eosinophil % 1  Basophil % 0  Immature Granulocytes % 0  NEUT# 1.7 - 7.7 K/uL 3.8   ASSESSMENT & PLAN:  Assessment/Plan:  A 68 y.o. male with a history of mild anemia and relative lymphocytosis.  When evaluating his labs today, I am pleased with his hematologic parameters.  His hemoglobin remains above 13.  His white count is normal; furthermore, his white count differential is normal.  This marks one of the few times where his relative lymphocytosis is not present.  Hematologically, he  appears to be doing well.  I do feel comfortable seeing him back in 1 year for repeat clinical assessment.  The patient understands all the plans discussed today and is in agreement with them.      Daiveon Markman DELENA Kerns, MD

## 2025-07-13 ENCOUNTER — Ambulatory Visit: Admitting: Urology

## 2025-09-07 ENCOUNTER — Other Ambulatory Visit

## 2025-09-07 ENCOUNTER — Ambulatory Visit: Admitting: Oncology
